# Patient Record
Sex: Female | Born: 1950 | Hispanic: Yes | Marital: Single | Smoking: Former smoker
Health system: Southern US, Community
[De-identification: ages and names within clinical notes are randomized; demographics above are authoritative.]

## PROBLEM LIST (undated history)

## (undated) DIAGNOSIS — F99 Mental disorder, not otherwise specified: Secondary | ICD-10-CM

## (undated) DIAGNOSIS — N649 Disorder of breast, unspecified: Secondary | ICD-10-CM

## (undated) DIAGNOSIS — C8590 Non-Hodgkin lymphoma, unspecified, unspecified site: Secondary | ICD-10-CM

## (undated) DIAGNOSIS — E119 Type 2 diabetes mellitus without complications: Secondary | ICD-10-CM

## (undated) DIAGNOSIS — J449 Chronic obstructive pulmonary disease, unspecified: Secondary | ICD-10-CM

## (undated) DIAGNOSIS — C50919 Malignant neoplasm of unspecified site of unspecified female breast: Secondary | ICD-10-CM

## (undated) DIAGNOSIS — I639 Cerebral infarction, unspecified: Secondary | ICD-10-CM

## (undated) DIAGNOSIS — I10 Essential (primary) hypertension: Secondary | ICD-10-CM

## (undated) HISTORY — DX: Disorder of breast, unspecified: N64.9

## (undated) HISTORY — DX: Malignant neoplasm of unspecified site of unspecified female breast: C50.919

## (undated) HISTORY — DX: Essential (primary) hypertension: I10

## (undated) HISTORY — PX: ABDOMINAL HYSTERECTOMY: SHX81

## (undated) HISTORY — PX: BREAST RECONSTRUCTION: SHX9

## (undated) HISTORY — DX: Mental disorder, not otherwise specified: F99

## (undated) HISTORY — DX: Chronic obstructive pulmonary disease, unspecified: J44.9

## (undated) HISTORY — PX: BACK SURGERY: SHX140

## (undated) HISTORY — DX: Type 2 diabetes mellitus without complications: E11.9

---

## 1991-03-21 DIAGNOSIS — C50919 Malignant neoplasm of unspecified site of unspecified female breast: Secondary | ICD-10-CM

## 1991-03-21 DIAGNOSIS — N649 Disorder of breast, unspecified: Secondary | ICD-10-CM

## 1991-03-21 HISTORY — DX: Malignant neoplasm of unspecified site of unspecified female breast: C50.919

## 1991-03-21 HISTORY — DX: Disorder of breast, unspecified: N64.9

## 2016-06-23 DIAGNOSIS — I10 Essential (primary) hypertension: Secondary | ICD-10-CM | POA: Diagnosis not present

## 2016-06-23 DIAGNOSIS — G4700 Insomnia, unspecified: Secondary | ICD-10-CM | POA: Diagnosis not present

## 2016-07-16 DIAGNOSIS — J209 Acute bronchitis, unspecified: Secondary | ICD-10-CM | POA: Diagnosis not present

## 2016-09-13 ENCOUNTER — Other Ambulatory Visit (HOSPITAL_COMMUNITY)
Admission: RE | Admit: 2016-09-13 | Discharge: 2016-09-13 | Disposition: A | Discharge status: Home / Self Care | Payer: Medicare Other | Source: Ambulatory Visit | Attending: Obstetrics & Gynecology | Admitting: Obstetrics & Gynecology

## 2016-09-13 ENCOUNTER — Ambulatory Visit (INDEPENDENT_AMBULATORY_CARE_PROVIDER_SITE_OTHER): Payer: Medicare Other | Admitting: Obstetrics & Gynecology

## 2016-09-13 ENCOUNTER — Encounter: Payer: Self-pay | Admitting: Obstetrics & Gynecology

## 2016-09-13 VITALS — BP 134/98 | Ht 65.0 in | Wt 134.0 lb

## 2016-09-13 DIAGNOSIS — Z853 Personal history of malignant neoplasm of breast: Secondary | ICD-10-CM | POA: Insufficient documentation

## 2016-09-13 DIAGNOSIS — Z90710 Acquired absence of both cervix and uterus: Secondary | ICD-10-CM | POA: Insufficient documentation

## 2016-09-13 DIAGNOSIS — Z01419 Encounter for gynecological examination (general) (routine) without abnormal findings: Secondary | ICD-10-CM | POA: Insufficient documentation

## 2016-09-13 DIAGNOSIS — Z1239 Encounter for other screening for malignant neoplasm of breast: Secondary | ICD-10-CM

## 2016-09-13 DIAGNOSIS — N898 Other specified noninflammatory disorders of vagina: Secondary | ICD-10-CM | POA: Diagnosis not present

## 2016-09-13 NOTE — Addendum Note (Signed)
Addended by: Lavonia Drafts on: 09/13/2016 03:25 PM   Modules accepted: Level of Service

## 2016-09-13 NOTE — Progress Notes (Signed)
Subjective:     Sandra Wade is a 66 y.o. female here for a routine exam. G3P0030 LMP 1993 Current complaints: pt is s/p breast ca dx'd in 1993. Pt took Tamoxifen for 2 years and stopped. She has not seen a doc re breast disease since 2008.  She is S/p left mastectomy and chemo; no radiation. Pt is also s/p TAH with BSO in 1994 for prophylaxis for the breast cancer. Pt reports the finding of scar tissue and endometrial  polyps in her uterus at the time of surgery.  No bleeding since that time or GYN other GYN problems since that time.  Pt reports currently freq yeast infxn. She reports that it is  'due to [place]'.  She reportts that her glucose is 'well controlled.'Pt checked her glc this am and it was 200. She reports that it was 'good'    Gynecologic History No LMP recorded. Patient has had a hysterectomy. Contraception: status post hysterectomy Last Pap: 2000's never abnormal Last mammogram: 2002. Results were: normal  Obstetric History Pt s/p SAB x 3-5  The following portions of the patient's history were reviewed and updated as appropriate: allergies, current medications, past family history, past medical history, past social history, past surgical history and problem list.  Review of Systems Pertinent items are noted in HPI.    Objective:  BP (!) 134/98 (BP Location: Right Arm)   Ht 5\' 5"  (1.651 m)   Wt 134 lb (60.8 kg)   BMI 22.30 kg/m   General Appearance:    Alert x 3, cooperative, no distress, appears stated age; pt has a very animated affect. Answers question sappropriately   Head:    Normocephalic, without obvious abnormality, atraumatic  Eyes:    conjunctiva/corneas clear, EOM's intact, both eyes- pt worse sunglasses most of the visit  Ears:    Normal external ear canals, both ears  Nose:   Nares normal, septum midline, mucosa normal, no drainage    or sinus tenderness  Throat:   Lips, mucosa, and tongue normal; teeth and gums normal  Neck:   Supple, symmetrical,  trachea midline, no adenopathy;    thyroid:  no enlargement/tenderness/nodules  Back:     Symmetric, no curvature, ROM normal, no CVA tenderness  Lungs:     Clear to auscultation bilaterally, respirations unlabored  Chest Wall:    No tenderness or deformity   Heart:    Regular rate and rhythm, S1 and S2 normal, no murmur, rub   or gallop  Breast Exam:    No tenderness, masses, or nipple abnormality; evidence of a mastectomy with reconstruction on the left side. Evidence of redxn mammoplasty on the right side.   Abdomen:     Soft, non-tender, bowel sounds active all four quadrants,    no masses, no organomegaly. Well healed abdominoplasty incision   Genitalia:    Normal female without lesion, discharge or tenderness- cervix and uterus surgically absent. Atrophy noted.     Extremities:   Extremities normal, atraumatic, no cyanosis or edema  Pulses:   2+ and symmetric all extremities  Skin:   Skin color, texture, turgor normal, no rashes or lesions     Assessment:    Healthy female exam.   H/o breast cancer- no recent follow up. Tamoxifen for 2 years Pt recently made an appt with for [place] primary care. Labs obtained today Possible chronic yeast- would suspect that this is from uncontrolled DM- no evidence of yeast on exam - KOH pending.  Plan:    Mammogram ordered. Follow up in: 1 year.    KOH pending Labs: HgbA1c, TSH , CBC, CMP and lipid panel drawn today for primary care   Eri Mcevers L. Harraway-Smith, M.D., Cherlynn June

## 2016-09-14 LAB — COMPREHENSIVE METABOLIC PANEL
ALK PHOS: 73 IU/L (ref 39–117)
ALT: 14 IU/L (ref 0–32)
AST: 17 IU/L (ref 0–40)
Albumin/Globulin Ratio: 1.6 (ref 1.2–2.2)
Albumin: 4.1 g/dL (ref 3.6–4.8)
BUN/Creatinine Ratio: 23 (ref 12–28)
BUN: 20 mg/dL (ref 8–27)
Bilirubin Total: 0.5 mg/dL (ref 0.0–1.2)
CALCIUM: 10.2 mg/dL (ref 8.7–10.3)
CO2: 30 mmol/L — AB (ref 20–29)
CREATININE: 0.88 mg/dL (ref 0.57–1.00)
Chloride: 97 mmol/L (ref 96–106)
GFR calc Af Amer: 80 mL/min/{1.73_m2} (ref >59)
GFR, EST NON AFRICAN AMERICAN: 69 mL/min/{1.73_m2} (ref >59)
GLUCOSE: 181 mg/dL — AB (ref 65–99)
Globulin, Total: 2.5 g/dL (ref 1.5–4.5)
Potassium: 4.7 mmol/L (ref 3.5–5.2)
Sodium: 142 mmol/L (ref 134–144)
Total Protein: 6.6 g/dL (ref 6.0–8.5)

## 2016-09-14 LAB — LIPID PANEL
CHOLESTEROL TOTAL: 183 mg/dL (ref 100–199)
Chol/HDL Ratio: 2.9 ratio (ref 0.0–4.4)
HDL: 64 mg/dL (ref >39)
LDL Calculated: 89 mg/dL (ref 0–99)
TRIGLYCERIDES: 148 mg/dL (ref 0–149)
VLDL Cholesterol Cal: 30 mg/dL (ref 5–40)

## 2016-09-14 LAB — HEMOGLOBIN A1C
ESTIMATED AVERAGE GLUCOSE: 237 mg/dL
HEMOGLOBIN A1C: 9.9 % — AB (ref 4.8–5.6)

## 2016-09-14 LAB — CBC
HEMATOCRIT: 41.5 % (ref 34.0–46.6)
HEMOGLOBIN: 13.9 g/dL (ref 11.1–15.9)
MCH: 29.7 pg (ref 26.6–33.0)
MCHC: 33.5 g/dL (ref 31.5–35.7)
MCV: 89 fL (ref 79–97)
Platelets: 186 10*3/uL (ref 150–379)
RBC: 4.68 x10E6/uL (ref 3.77–5.28)
RDW: 13.9 % (ref 12.3–15.4)
WBC: 6.7 10*3/uL (ref 3.4–10.8)

## 2016-09-14 LAB — TSH: TSH: 1.07 u[IU]/mL (ref 0.450–4.500)

## 2016-09-15 LAB — CERVICOVAGINAL ANCILLARY ONLY
BACTERIAL VAGINITIS: NEGATIVE
Candida vaginitis: NEGATIVE

## 2016-09-18 ENCOUNTER — Inpatient Hospital Stay (HOSPITAL_BASED_OUTPATIENT_CLINIC_OR_DEPARTMENT_OTHER): Admission: RE | Admit: 2016-09-18 | Payer: Medicare Other | Source: Ambulatory Visit

## 2016-09-21 ENCOUNTER — Telehealth: Payer: Self-pay

## 2016-09-21 DIAGNOSIS — I10 Essential (primary) hypertension: Secondary | ICD-10-CM | POA: Diagnosis not present

## 2016-09-21 DIAGNOSIS — M545 Low back pain: Secondary | ICD-10-CM | POA: Diagnosis not present

## 2016-09-21 DIAGNOSIS — Z760 Encounter for issue of repeat prescription: Secondary | ICD-10-CM | POA: Diagnosis not present

## 2016-09-21 NOTE — Telephone Encounter (Signed)
Tried to reach pt to let her know that her testing came back negative for BV and yeast. Pt did not answer phone and her voicemail is not set up.

## 2016-09-25 ENCOUNTER — Telehealth: Payer: Self-pay | Admitting: General Practice

## 2016-09-25 NOTE — Telephone Encounter (Signed)
Caller name: Ezzard Flax from Dr. Ihor Dow  Relation to pt: clinic scheduler  Call back number: ext [phone]  Reason for call:  Patient was seen by Lavonia Drafts, MD 09/13/16 and specialist would like to know if patient can be seen for diabetic concerns acute only prior to her 10/19/16 new patient appointment, please advise.

## 2016-09-25 NOTE — Telephone Encounter (Signed)
Patient schedule for 09/27/2016 30 minutes.

## 2016-09-25 NOTE — Telephone Encounter (Signed)
Sure that is fine, we can schedule her

## 2016-09-26 NOTE — Progress Notes (Deleted)
[place] at [facility] [address] [place], [place] [address] [phone]  Date:  09/27/2016   Name:  Sandra Wade   DOB:  11/17/1950   MRN:  [id number]  PCP:  Patient, No Pcp Per    Chief Complaint: No chief complaint on file.   History of Present Illness:  Sandra Wade is a 66 y.o. very pleasant female patient who presents with the following:  Here today as a new patient to discuss her diabetes.  Dr. Purvis Kilts saw her about 2 weeks ago and was concerned about her uncontrolled DM: Sandra Wade is a 66 y.o. female here for a routine exam. G3P0030 LMP 1993 Current complaints: pt is s/p breast ca dx'd in 1993. Pt took Tamoxifen for 2 years and stopped. She has not seen a doc re breast disease since 2008.  She is S/p left mastectomy and chemo; no radiation. Pt is also s/p TAH with BSO in 1994 for prophylaxis for the breast cancer. Pt reports the finding of scar tissue and endometrial  polyps in her uterus at the time of surgery.  No bleeding since that time or GYN other GYN problems since that time.  Pt reports currently freq yeast infxn. She reports that it is  'due to [place]'.  She reportts that her glucose is 'well controlled.'Pt checked her glc this am and it was 200. She reports that it was 'good'    She had labs per Dr. Sheldon Silvan as below- her Dm does not appear to be under good control.   Office Visit on 09/13/2016  Component Date Value Ref Range Status  . WBC 09/13/2016 6.7  3.4 - 10.8 x10E3/uL Final  . RBC 09/13/2016 4.68  3.77 - 5.28 x10E6/uL Final  . Hemoglobin 09/13/2016 13.9  11.1 - 15.9 g/dL Final  . Hematocrit 09/13/2016 41.5  34.0 - 46.6 % Final  . MCV 09/13/2016 89  79 - 97 fL Final  . MCH 09/13/2016 29.7  26.6 - 33.0 pg Final  . MCHC 09/13/2016 33.5  31.5 - 35.7 g/dL Final  . RDW 09/13/2016 13.9  12.3 - 15.4 % Final  . Platelets 09/13/2016 186  150 - 379 x10E3/uL Final  . Glucose 09/13/2016 181* 65  - 99 mg/dL Final  . BUN 09/13/2016 20  8 - 27 mg/dL Final  . Creatinine, Ser 09/13/2016 0.88  0.57 - 1.00 mg/dL Final  . GFR calc non Af Amer 09/13/2016 69  >59 mL/min/1.73 Final  . GFR calc Af Amer 09/13/2016 80  >59 mL/min/1.73 Final  . BUN/Creatinine Ratio 09/13/2016 23  12 - 28 Final  . Sodium 09/13/2016 142  134 - 144 mmol/L Final  . Potassium 09/13/2016 4.7  3.5 - 5.2 mmol/L Final  . Chloride 09/13/2016 97  96 - 106 mmol/L Final  . CO2 09/13/2016 30* 20 - 29 mmol/L Final                 **Please note reference interval change**  . Calcium 09/13/2016 10.2  8.7 - 10.3 mg/dL Final  . Total Protein 09/13/2016 6.6  6.0 - 8.5 g/dL Final  . Albumin 09/13/2016 4.1  3.6 - 4.8 g/dL Final  . Globulin, Total 09/13/2016 2.5  1.5 - 4.5 g/dL Final  . Albumin/Globulin Ratio 09/13/2016 1.6  1.2 - 2.2 Final  . Bilirubin Total 09/13/2016 0.5  0.0 - 1.2 mg/dL Final  . Alkaline Phosphatase 09/13/2016 73  39 - 117 IU/L Final  .  AST 09/13/2016 17  0 - 40 IU/L Final  . ALT 09/13/2016 14  0 - 32 IU/L Final  . TSH 09/13/2016 1.070  0.450 - 4.500 uIU/mL Final  . Hgb A1c MFr Bld 09/13/2016 9.9* 4.8 - 5.6 % Final   Comment:          Pre-diabetes: 5.7 - 6.4          Diabetes: >6.4          Glycemic control for adults with diabetes: <7.0   . Est. average glucose Bld gHb Est-m* 09/13/2016 237  mg/dL Final  . Cholesterol, Total 09/13/2016 183  100 - 199 mg/dL Final  . Triglycerides 09/13/2016 148  0 - 149 mg/dL Final  . HDL 09/13/2016 64  >39 mg/dL Final  . VLDL Cholesterol Cal 09/13/2016 30  5 - 40 mg/dL Final  . LDL Calculated 09/13/2016 89  0 - 99 mg/dL Final  . Chol/HDL Ratio 09/13/2016 2.9  0.0 - 4.4 ratio Final   Comment:                                   T. Chol/HDL Ratio                                             Men  Women                               1/2 Avg.Risk  3.4    3.3                                   Avg.Risk  5.0    4.4                                2X Avg.Risk  9.6    7.1                                 3X Avg.Risk 23.4   11.0   . Bacterial vaginitis 09/13/2016 Negative for Bacterial Vaginitis Microorganisms   Final   Normal Reference Range - Negative  . Candida vaginitis 09/13/2016 Negative for Candida species   Final   Normal Reference Range - Negative     There are no active problems to display for this patient.   Past Medical History:  Diagnosis Date  . Breast cancer ([place]) 1993  . Breast disorder 1993   Left breast cancer   . COPD (chronic obstructive pulmonary disease) ([place])   . Diabetes mellitus without complication ([place])   . Hypertension   . Mental disorder     Past Surgical History:  Procedure Laterality Date  . ABDOMINAL HYSTERECTOMY    . BACK SURGERY    . BREAST RECONSTRUCTION      Social History  Substance Use Topics  . Smoking status: Current Every Day Smoker    Packs/day: 1.00    Years: 20.00  . Smokeless tobacco: Never Used  . Alcohol use Yes    Family History  Problem Relation Age of Onset  . Hypertension Mother   .  Stroke Brother   . Cancer Neg Hx   . Diabetes Neg Hx     No Known Allergies  Medication list has been reviewed and updated.  Current Outpatient Prescriptions on File Prior to Visit  Medication Sig Dispense Refill  . amLODipine (NORVASC) 10 MG tablet Take 10 mg by mouth daily.    . benazepril (LOTENSIN) 40 MG tablet Take 40 mg by mouth daily.    . clonazePAM (KLONOPIN) 1 MG tablet     . cloNIDine (CATAPRES) 0.1 MG tablet Take 0.1 mg by mouth 2 (two) times daily.    . divalproex (DEPAKOTE) 500 MG DR tablet     . HYDROcodone-acetaminophen (NORCO) 7.5-325 MG tablet Take 1 tablet by mouth every 6 (six) hours as needed for moderate pain.    Marland Kitchen LANTUS SOLOSTAR 100 UNIT/ML Solostar Pen     . levofloxacin (LEVAQUIN) 500 MG tablet     . ONE TOUCH ULTRA TEST test strip     . VICTOZA 18 MG/3ML SOPN     . zolpidem (AMBIEN) 10 MG tablet      No current facility-administered medications on file prior to visit.      Review of Systems:  As per HPI- otherwise negative.   Physical Examination: There were no vitals filed for this visit. There were no vitals filed for this visit. There is no height or weight on file to calculate BMI. Ideal Body Weight:    GEN: WDWN, NAD, Non-toxic, A & O x 3 HEENT: Atraumatic, Normocephalic. Neck supple. No masses, No LAD. Ears and Nose: No external deformity. CV: RRR, No M/G/R. No JVD. No thrill. No extra heart sounds. PULM: CTA B, no wheezes, crackles, rhonchi. No retractions. No resp. distress. No accessory muscle use. ABD: S, NT, ND, +BS. No rebound. No HSM. EXTR: No c/c/e NEURO Normal gait.  PSYCH: Normally interactive. Conversant. Not depressed or anxious appearing.  Calm demeanor.    Assessment and Plan: ***  Signed Lamar Blinks, MD

## 2016-09-27 ENCOUNTER — Ambulatory Visit: Payer: Medicare Other | Admitting: Family Medicine

## 2016-10-18 ENCOUNTER — Telehealth: Payer: Self-pay | Admitting: Behavioral Health

## 2016-10-18 NOTE — Telephone Encounter (Signed)
Attempted to reach patient for Pre-Visit Call. Per recording, the voice mailbox has not been set up; unable to leave a message at this time.

## 2016-10-19 ENCOUNTER — Ambulatory Visit: Payer: Self-pay | Admitting: Family Medicine

## 2016-10-19 DIAGNOSIS — Z0289 Encounter for other administrative examinations: Secondary | ICD-10-CM

## 2016-12-24 DIAGNOSIS — R7309 Other abnormal glucose: Secondary | ICD-10-CM | POA: Diagnosis not present

## 2016-12-24 DIAGNOSIS — Z794 Long term (current) use of insulin: Secondary | ICD-10-CM | POA: Diagnosis not present

## 2016-12-24 DIAGNOSIS — E119 Type 2 diabetes mellitus without complications: Secondary | ICD-10-CM | POA: Diagnosis not present

## 2017-01-19 ENCOUNTER — Ambulatory Visit (HOSPITAL_BASED_OUTPATIENT_CLINIC_OR_DEPARTMENT_OTHER)
Admission: RE | Admit: 2017-01-19 | Discharge: 2017-01-19 | Disposition: A | Discharge status: Home / Self Care | Payer: Medicare Other | Source: Ambulatory Visit | Attending: Physician Assistant | Admitting: Physician Assistant

## 2017-01-19 ENCOUNTER — Other Ambulatory Visit (HOSPITAL_BASED_OUTPATIENT_CLINIC_OR_DEPARTMENT_OTHER): Payer: Self-pay | Admitting: Physician Assistant

## 2017-01-19 DIAGNOSIS — M7989 Other specified soft tissue disorders: Secondary | ICD-10-CM | POA: Diagnosis not present

## 2017-01-19 DIAGNOSIS — R600 Localized edema: Secondary | ICD-10-CM | POA: Diagnosis not present

## 2017-01-19 DIAGNOSIS — I890 Lymphedema, not elsewhere classified: Secondary | ICD-10-CM | POA: Diagnosis not present

## 2017-01-19 DIAGNOSIS — W57XXXA Bitten or stung by nonvenomous insect and other nonvenomous arthropods, initial encounter: Secondary | ICD-10-CM | POA: Diagnosis not present

## 2017-01-21 NOTE — Progress Notes (Deleted)
[place] at [facility] [address], [place], [place] [address] [phone]  Date:  01/24/2017   Name:  Sandra Wade   DOB:  1950/10/24   MRN:  [id number]  PCP:  Patient, No Pcp Per    Chief Complaint: No chief complaint on file.   History of Present Illness:  Sandra Wade is a 66 y.o. very pleasant female patient who presents with the following:  Here today as a new patient to establish care History of breast cancer, COPD, DM, HTN, mental illness She went to an UC about a month ago as she did not have a PCP to rx her medications:  Sandra Wade is a 66 y.o. who presents as a new patient for diabetes. She notes she ran out of the victoza and needs a refill. She has not established with a PCP at this time. Current symptoms include: none. Patient denies foot ulcerations, hyperglycemia, hypoglycemia , increased appetite, nausea, paresthesia of the feet, polydipsia, polyuria, visual disturbances, vomiting and weight loss. Evaluation to date has included: fasting blood sugar, fasting lipid panel, hemoglobin A1C and microalbuminuria. Home sugars: BGs range between 140 and 150. Current treatments: Continued insulin which has been somewhat effective and Continued ACE inhibitor/ARB which has been effective. The patient denies chest pain, LOC, LE edema, abdominal pain, SOB, wheezing, fever, chills, body aches, bloody/black stools, hematuria, or changes in urinary or bowel habits.  Lab Results  Component Value Date   HGBA1C 9.9 (H) 09/13/2016     There are no active problems to display for this patient.   Past Medical History:  Diagnosis Date  . Breast cancer ([place]) 1993  . Breast disorder 1993   Left breast cancer   . COPD (chronic obstructive pulmonary disease) ([place])   . Diabetes mellitus without complication ([place])   . Hypertension   . Mental disorder     Past Surgical History:  Procedure Laterality Date  .  ABDOMINAL HYSTERECTOMY    . BACK SURGERY    . BREAST RECONSTRUCTION      Social History   Tobacco Use  . Smoking status: Current Every Day Smoker    Packs/day: 1.00    Years: 20.00    Pack years: 20.00  . Smokeless tobacco: Never Used  Substance Use Topics  . Alcohol use: Yes  . Drug use: No    Family History  Problem Relation Age of Onset  . Hypertension Mother   . Stroke Brother   . Cancer Neg Hx   . Diabetes Neg Hx     No Known Allergies  Medication list has been reviewed and updated.  Current Outpatient Medications on File Prior to Visit  Medication Sig Dispense Refill  . amLODipine (NORVASC) 10 MG tablet Take 10 mg by mouth daily.    . benazepril (LOTENSIN) 40 MG tablet Take 40 mg by mouth daily.    . clonazePAM (KLONOPIN) 1 MG tablet     . cloNIDine (CATAPRES) 0.1 MG tablet Take 0.1 mg by mouth 2 (two) times daily.    . divalproex (DEPAKOTE) 500 MG DR tablet     . HYDROcodone-acetaminophen (NORCO) 7.5-325 MG tablet Take 1 tablet by mouth every 6 (six) hours as needed for moderate pain.    Marland Kitchen LANTUS SOLOSTAR 100 UNIT/ML Solostar Pen     . levofloxacin (LEVAQUIN) 500 MG tablet     . ONE TOUCH ULTRA TEST test strip     . VICTOZA 18 MG/3ML SOPN     .  zolpidem (AMBIEN) 10 MG tablet      No current facility-administered medications on file prior to visit.     Review of Systems:  As per HPI- otherwise negative.   Physical Examination: There were no vitals filed for this visit. There were no vitals filed for this visit. There is no height or weight on file to calculate BMI. Ideal Body Weight:    GEN: WDWN, NAD, Non-toxic, A & O x 3 HEENT: Atraumatic, Normocephalic. Neck supple. No masses, No LAD. Ears and Nose: No external deformity. CV: RRR, No M/G/R. No JVD. No thrill. No extra heart sounds. PULM: CTA B, no wheezes, crackles, rhonchi. No retractions. No resp. distress. No accessory muscle use. ABD: S, NT, ND, +BS. No rebound. No HSM. EXTR: No  c/c/e NEURO Normal gait.  PSYCH: Normally interactive. Conversant. Not depressed or anxious appearing.  Calm demeanor.    Assessment and Plan: ***  Signed Lamar Blinks, MD

## 2017-01-24 ENCOUNTER — Ambulatory Visit: Payer: Medicare Other | Admitting: Family Medicine

## 2017-02-22 DIAGNOSIS — R319 Hematuria, unspecified: Secondary | ICD-10-CM | POA: Diagnosis not present

## 2017-02-22 DIAGNOSIS — Z794 Long term (current) use of insulin: Secondary | ICD-10-CM | POA: Diagnosis not present

## 2017-02-22 DIAGNOSIS — R42 Dizziness and giddiness: Secondary | ICD-10-CM | POA: Diagnosis not present

## 2017-02-22 DIAGNOSIS — R350 Frequency of micturition: Secondary | ICD-10-CM | POA: Diagnosis not present

## 2017-02-22 DIAGNOSIS — E119 Type 2 diabetes mellitus without complications: Secondary | ICD-10-CM | POA: Diagnosis not present

## 2017-04-26 DIAGNOSIS — R319 Hematuria, unspecified: Secondary | ICD-10-CM | POA: Diagnosis not present

## 2017-04-26 DIAGNOSIS — E1165 Type 2 diabetes mellitus with hyperglycemia: Secondary | ICD-10-CM | POA: Diagnosis not present

## 2017-06-14 DIAGNOSIS — J209 Acute bronchitis, unspecified: Secondary | ICD-10-CM | POA: Diagnosis not present

## 2017-06-14 DIAGNOSIS — S335XXA Sprain of ligaments of lumbar spine, initial encounter: Secondary | ICD-10-CM | POA: Diagnosis not present

## 2017-06-14 DIAGNOSIS — G4700 Insomnia, unspecified: Secondary | ICD-10-CM | POA: Diagnosis not present

## 2017-06-14 DIAGNOSIS — H1132 Conjunctival hemorrhage, left eye: Secondary | ICD-10-CM | POA: Diagnosis not present

## 2017-06-14 DIAGNOSIS — S0093XA Contusion of unspecified part of head, initial encounter: Secondary | ICD-10-CM | POA: Diagnosis not present

## 2017-07-30 DIAGNOSIS — I10 Essential (primary) hypertension: Secondary | ICD-10-CM | POA: Diagnosis not present

## 2017-07-30 DIAGNOSIS — Z794 Long term (current) use of insulin: Secondary | ICD-10-CM | POA: Diagnosis not present

## 2017-07-30 DIAGNOSIS — E1165 Type 2 diabetes mellitus with hyperglycemia: Secondary | ICD-10-CM | POA: Diagnosis not present

## 2017-08-08 DIAGNOSIS — M79672 Pain in left foot: Secondary | ICD-10-CM | POA: Diagnosis not present

## 2017-08-08 DIAGNOSIS — M79671 Pain in right foot: Secondary | ICD-10-CM | POA: Diagnosis not present

## 2017-08-08 DIAGNOSIS — E1142 Type 2 diabetes mellitus with diabetic polyneuropathy: Secondary | ICD-10-CM | POA: Diagnosis not present

## 2017-08-08 DIAGNOSIS — I872 Venous insufficiency (chronic) (peripheral): Secondary | ICD-10-CM | POA: Diagnosis not present

## 2017-08-22 DIAGNOSIS — L03115 Cellulitis of right lower limb: Secondary | ICD-10-CM | POA: Diagnosis not present

## 2017-08-28 DIAGNOSIS — K5909 Other constipation: Secondary | ICD-10-CM | POA: Diagnosis not present

## 2017-08-28 DIAGNOSIS — E1165 Type 2 diabetes mellitus with hyperglycemia: Secondary | ICD-10-CM | POA: Diagnosis not present

## 2017-08-28 DIAGNOSIS — Z794 Long term (current) use of insulin: Secondary | ICD-10-CM | POA: Diagnosis not present

## 2017-08-28 DIAGNOSIS — I8392 Asymptomatic varicose veins of left lower extremity: Secondary | ICD-10-CM | POA: Diagnosis not present

## 2017-08-28 DIAGNOSIS — L03115 Cellulitis of right lower limb: Secondary | ICD-10-CM | POA: Diagnosis not present

## 2017-09-24 DIAGNOSIS — M5416 Radiculopathy, lumbar region: Secondary | ICD-10-CM | POA: Diagnosis not present

## 2017-09-29 DIAGNOSIS — S90221A Contusion of right lesser toe(s) with damage to nail, initial encounter: Secondary | ICD-10-CM | POA: Diagnosis not present

## 2017-11-01 DIAGNOSIS — Z794 Long term (current) use of insulin: Secondary | ICD-10-CM | POA: Diagnosis not present

## 2017-11-01 DIAGNOSIS — E1165 Type 2 diabetes mellitus with hyperglycemia: Secondary | ICD-10-CM | POA: Diagnosis not present

## 2017-11-01 DIAGNOSIS — E1142 Type 2 diabetes mellitus with diabetic polyneuropathy: Secondary | ICD-10-CM | POA: Diagnosis not present

## 2017-11-01 DIAGNOSIS — I872 Venous insufficiency (chronic) (peripheral): Secondary | ICD-10-CM | POA: Diagnosis not present

## 2017-11-01 DIAGNOSIS — K5909 Other constipation: Secondary | ICD-10-CM | POA: Diagnosis not present

## 2017-11-30 DIAGNOSIS — Z794 Long term (current) use of insulin: Secondary | ICD-10-CM | POA: Diagnosis not present

## 2017-11-30 DIAGNOSIS — R42 Dizziness and giddiness: Secondary | ICD-10-CM | POA: Diagnosis not present

## 2017-11-30 DIAGNOSIS — I10 Essential (primary) hypertension: Secondary | ICD-10-CM | POA: Diagnosis not present

## 2017-11-30 DIAGNOSIS — E1165 Type 2 diabetes mellitus with hyperglycemia: Secondary | ICD-10-CM | POA: Diagnosis not present

## 2017-12-13 DIAGNOSIS — E1142 Type 2 diabetes mellitus with diabetic polyneuropathy: Secondary | ICD-10-CM | POA: Diagnosis not present

## 2017-12-13 DIAGNOSIS — J90 Pleural effusion, not elsewhere classified: Secondary | ICD-10-CM | POA: Diagnosis not present

## 2017-12-13 DIAGNOSIS — E11319 Type 2 diabetes mellitus with unspecified diabetic retinopathy without macular edema: Secondary | ICD-10-CM | POA: Diagnosis not present

## 2017-12-13 DIAGNOSIS — Z23 Encounter for immunization: Secondary | ICD-10-CM | POA: Diagnosis not present

## 2017-12-13 DIAGNOSIS — J9811 Atelectasis: Secondary | ICD-10-CM | POA: Diagnosis not present

## 2017-12-13 DIAGNOSIS — L84 Corns and callosities: Secondary | ICD-10-CM | POA: Diagnosis not present

## 2017-12-17 DIAGNOSIS — R9389 Abnormal findings on diagnostic imaging of other specified body structures: Secondary | ICD-10-CM | POA: Diagnosis not present

## 2017-12-17 DIAGNOSIS — I700 Atherosclerosis of aorta: Secondary | ICD-10-CM | POA: Diagnosis not present

## 2017-12-17 DIAGNOSIS — J984 Other disorders of lung: Secondary | ICD-10-CM | POA: Diagnosis not present

## 2017-12-17 DIAGNOSIS — R918 Other nonspecific abnormal finding of lung field: Secondary | ICD-10-CM | POA: Diagnosis not present

## 2017-12-17 DIAGNOSIS — R042 Hemoptysis: Secondary | ICD-10-CM | POA: Diagnosis not present

## 2017-12-17 DIAGNOSIS — R05 Cough: Secondary | ICD-10-CM | POA: Diagnosis not present

## 2017-12-27 DIAGNOSIS — R809 Proteinuria, unspecified: Secondary | ICD-10-CM | POA: Diagnosis not present

## 2017-12-27 DIAGNOSIS — N179 Acute kidney failure, unspecified: Secondary | ICD-10-CM | POA: Diagnosis not present

## 2017-12-27 DIAGNOSIS — E1129 Type 2 diabetes mellitus with other diabetic kidney complication: Secondary | ICD-10-CM | POA: Diagnosis not present

## 2017-12-27 DIAGNOSIS — I10 Essential (primary) hypertension: Secondary | ICD-10-CM | POA: Diagnosis not present

## 2018-01-02 DIAGNOSIS — N179 Acute kidney failure, unspecified: Secondary | ICD-10-CM | POA: Diagnosis not present

## 2018-01-18 DIAGNOSIS — M79672 Pain in left foot: Secondary | ICD-10-CM | POA: Diagnosis not present

## 2018-01-18 DIAGNOSIS — M7989 Other specified soft tissue disorders: Secondary | ICD-10-CM | POA: Diagnosis not present

## 2018-01-18 DIAGNOSIS — R2242 Localized swelling, mass and lump, left lower limb: Secondary | ICD-10-CM | POA: Diagnosis not present

## 2018-03-02 ENCOUNTER — Emergency Department (HOSPITAL_BASED_OUTPATIENT_CLINIC_OR_DEPARTMENT_OTHER): Payer: Medicare Other

## 2018-03-02 ENCOUNTER — Other Ambulatory Visit: Payer: Self-pay

## 2018-03-02 ENCOUNTER — Encounter (HOSPITAL_BASED_OUTPATIENT_CLINIC_OR_DEPARTMENT_OTHER): Payer: Self-pay | Admitting: Emergency Medicine

## 2018-03-02 ENCOUNTER — Inpatient Hospital Stay (HOSPITAL_BASED_OUTPATIENT_CLINIC_OR_DEPARTMENT_OTHER)
Admission: EM | Admit: 2018-03-02 | Discharge: 2018-03-06 | DRG: 193 | Disposition: A | Discharge status: Home Health | Payer: Medicare Other | Attending: Internal Medicine | Admitting: Internal Medicine

## 2018-03-02 DIAGNOSIS — Z90710 Acquired absence of both cervix and uterus: Secondary | ICD-10-CM

## 2018-03-02 DIAGNOSIS — Z87891 Personal history of nicotine dependence: Secondary | ICD-10-CM

## 2018-03-02 DIAGNOSIS — E1165 Type 2 diabetes mellitus with hyperglycemia: Secondary | ICD-10-CM | POA: Diagnosis not present

## 2018-03-02 DIAGNOSIS — T380X5A Adverse effect of glucocorticoids and synthetic analogues, initial encounter: Secondary | ICD-10-CM | POA: Diagnosis not present

## 2018-03-02 DIAGNOSIS — Z794 Long term (current) use of insulin: Secondary | ICD-10-CM

## 2018-03-02 DIAGNOSIS — J440 Chronic obstructive pulmonary disease with acute lower respiratory infection: Secondary | ICD-10-CM | POA: Diagnosis present

## 2018-03-02 DIAGNOSIS — F419 Anxiety disorder, unspecified: Secondary | ICD-10-CM | POA: Diagnosis present

## 2018-03-02 DIAGNOSIS — Z66 Do not resuscitate: Secondary | ICD-10-CM | POA: Diagnosis not present

## 2018-03-02 DIAGNOSIS — G894 Chronic pain syndrome: Secondary | ICD-10-CM | POA: Diagnosis present

## 2018-03-02 DIAGNOSIS — E119 Type 2 diabetes mellitus without complications: Secondary | ICD-10-CM

## 2018-03-02 DIAGNOSIS — J189 Pneumonia, unspecified organism: Secondary | ICD-10-CM | POA: Diagnosis not present

## 2018-03-02 DIAGNOSIS — I10 Essential (primary) hypertension: Secondary | ICD-10-CM

## 2018-03-02 DIAGNOSIS — Z79899 Other long term (current) drug therapy: Secondary | ICD-10-CM

## 2018-03-02 DIAGNOSIS — R05 Cough: Secondary | ICD-10-CM | POA: Diagnosis not present

## 2018-03-02 DIAGNOSIS — Z743 Need for continuous supervision: Secondary | ICD-10-CM | POA: Diagnosis not present

## 2018-03-02 DIAGNOSIS — Z79891 Long term (current) use of opiate analgesic: Secondary | ICD-10-CM | POA: Diagnosis not present

## 2018-03-02 DIAGNOSIS — Z853 Personal history of malignant neoplasm of breast: Secondary | ICD-10-CM

## 2018-03-02 DIAGNOSIS — J9601 Acute respiratory failure with hypoxia: Secondary | ICD-10-CM | POA: Diagnosis present

## 2018-03-02 DIAGNOSIS — Z720 Tobacco use: Secondary | ICD-10-CM

## 2018-03-02 DIAGNOSIS — R0902 Hypoxemia: Secondary | ICD-10-CM | POA: Diagnosis not present

## 2018-03-02 DIAGNOSIS — F319 Bipolar disorder, unspecified: Secondary | ICD-10-CM

## 2018-03-02 DIAGNOSIS — R0602 Shortness of breath: Secondary | ICD-10-CM | POA: Diagnosis not present

## 2018-03-02 DIAGNOSIS — J441 Chronic obstructive pulmonary disease with (acute) exacerbation: Secondary | ICD-10-CM | POA: Diagnosis not present

## 2018-03-02 LAB — CBC WITH DIFFERENTIAL/PLATELET
Abs Immature Granulocytes: 0.17 10*3/uL — ABNORMAL HIGH (ref 0.00–0.07)
BASOS ABS: 0.1 10*3/uL (ref 0.0–0.1)
Basophils Relative: 1 %
EOS ABS: 0.2 10*3/uL (ref 0.0–0.5)
Eosinophils Relative: 1 %
HCT: 43 % (ref 36.0–46.0)
Hemoglobin: 13.6 g/dL (ref 12.0–15.0)
Immature Granulocytes: 1 %
Lymphocytes Relative: 6 %
Lymphs Abs: 0.8 10*3/uL (ref 0.7–4.0)
MCH: 28.6 pg (ref 26.0–34.0)
MCHC: 31.6 g/dL (ref 30.0–36.0)
MCV: 90.5 fL (ref 80.0–100.0)
Monocytes Absolute: 1.2 10*3/uL — ABNORMAL HIGH (ref 0.1–1.0)
Monocytes Relative: 10 %
Neutro Abs: 9.8 10*3/uL — ABNORMAL HIGH (ref 1.7–7.7)
Neutrophils Relative %: 81 %
Platelets: 282 10*3/uL (ref 150–400)
RBC: 4.75 MIL/uL (ref 3.87–5.11)
RDW: 13.1 % (ref 11.5–15.5)
WBC: 12.2 10*3/uL — ABNORMAL HIGH (ref 4.0–10.5)
nRBC: 0 % (ref 0.0–0.2)

## 2018-03-02 LAB — BASIC METABOLIC PANEL
Anion gap: 10 (ref 5–15)
BUN: 19 mg/dL (ref 8–23)
CO2: 34 mmol/L — AB (ref 22–32)
Calcium: 9.5 mg/dL (ref 8.9–10.3)
Chloride: 90 mmol/L — ABNORMAL LOW (ref 98–111)
Creatinine, Ser: 0.64 mg/dL (ref 0.44–1.00)
GFR calc Af Amer: >60 mL/min (ref >60)
GFR calc non Af Amer: >60 mL/min (ref >60)
Glucose, Bld: 441 mg/dL — ABNORMAL HIGH (ref 70–99)
Potassium: 4 mmol/L (ref 3.5–5.1)
Sodium: 134 mmol/L — ABNORMAL LOW (ref 135–145)

## 2018-03-02 LAB — STREP PNEUMONIAE URINARY ANTIGEN: Strep Pneumo Urinary Antigen: NEGATIVE

## 2018-03-02 LAB — TROPONIN I: Troponin I: 0.06 ng/mL (ref <0.03)

## 2018-03-02 LAB — GLUCOSE, CAPILLARY
Glucose-Capillary: 435 mg/dL — ABNORMAL HIGH (ref 70–99)
Glucose-Capillary: 585 mg/dL (ref 70–99)

## 2018-03-02 LAB — CBG MONITORING, ED: Glucose-Capillary: 437 mg/dL — ABNORMAL HIGH (ref 70–99)

## 2018-03-02 MED ORDER — ALPRAZOLAM 0.5 MG PO TABS
1.0000 mg | ORAL_TABLET | Freq: Every evening | ORAL | Status: DC | PRN
  Administered 2018-03-02 – 2018-03-03 (×2): 1 mg via ORAL
  Administered 2018-03-04: 0.5 mg via ORAL
  Filled 2018-03-02 (×3): qty 2

## 2018-03-02 MED ORDER — OLANZAPINE 10 MG PO TABS
10.0000 mg | ORAL_TABLET | Freq: Every day | ORAL | Status: DC
  Administered 2018-03-02 – 2018-03-05 (×4): 10 mg via ORAL
  Filled 2018-03-02 (×4): qty 1

## 2018-03-02 MED ORDER — ONDANSETRON HCL 4 MG/2ML IJ SOLN
4.0000 mg | Freq: Four times a day (QID) | INTRAMUSCULAR | Status: DC | PRN
  Administered 2018-03-06: 4 mg via INTRAVENOUS
  Filled 2018-03-02: qty 2

## 2018-03-02 MED ORDER — INSULIN ASPART 100 UNIT/ML ~~LOC~~ SOLN: 10.0000 [IU] | Freq: Once | SUBCUTANEOUS | Status: DC

## 2018-03-02 MED ORDER — BENAZEPRIL HCL 40 MG PO TABS
40.0000 mg | ORAL_TABLET | Freq: Every day | ORAL | Status: DC
  Administered 2018-03-03 – 2018-03-06 (×4): 40 mg via ORAL
  Filled 2018-03-02 (×4): qty 1

## 2018-03-02 MED ORDER — INSULIN GLARGINE 100 UNIT/ML ~~LOC~~ SOLN
15.0000 [IU] | Freq: Every day | SUBCUTANEOUS | Status: DC
  Administered 2018-03-02: 15 [IU] via SUBCUTANEOUS
  Filled 2018-03-02: qty 0.15

## 2018-03-02 MED ORDER — SODIUM CHLORIDE 0.9 % IV SOLN
1.0000 g | INTRAVENOUS | Status: DC
  Administered 2018-03-02 – 2018-03-03 (×2): 1 g via INTRAVENOUS
  Filled 2018-03-02 (×3): qty 10

## 2018-03-02 MED ORDER — DIVALPROEX SODIUM 500 MG PO DR TAB
500.0000 mg | DELAYED_RELEASE_TABLET | Freq: Every day | ORAL | Status: DC
  Administered 2018-03-03 – 2018-03-05 (×3): 500 mg via ORAL
  Filled 2018-03-02 (×5): qty 1

## 2018-03-02 MED ORDER — ENOXAPARIN SODIUM 40 MG/0.4ML ~~LOC~~ SOLN
40.0000 mg | SUBCUTANEOUS | Status: DC
  Administered 2018-03-02 – 2018-03-05 (×4): 40 mg via SUBCUTANEOUS
  Filled 2018-03-02 (×4): qty 0.4

## 2018-03-02 MED ORDER — INSULIN ASPART 100 UNIT/ML ~~LOC~~ SOLN
15.0000 [IU] | Freq: Once | SUBCUTANEOUS | Status: AC
  Administered 2018-03-02: 15 [IU] via SUBCUTANEOUS

## 2018-03-02 MED ORDER — AMLODIPINE BESYLATE 10 MG PO TABS
10.0000 mg | ORAL_TABLET | Freq: Every day | ORAL | Status: DC
  Administered 2018-03-03 – 2018-03-06 (×4): 10 mg via ORAL
  Filled 2018-03-02 (×4): qty 1

## 2018-03-02 MED ORDER — ALBUTEROL SULFATE (2.5 MG/3ML) 0.083% IN NEBU
5.0000 mg | INHALATION_SOLUTION | Freq: Once | RESPIRATORY_TRACT | Status: AC
  Administered 2018-03-02: 5 mg via RESPIRATORY_TRACT
  Filled 2018-03-02: qty 6

## 2018-03-02 MED ORDER — METHYLPREDNISOLONE SODIUM SUCC 125 MG IJ SOLR
125.0000 mg | Freq: Once | INTRAMUSCULAR | Status: AC
  Administered 2018-03-02: 125 mg via INTRAVENOUS
  Filled 2018-03-02: qty 2

## 2018-03-02 MED ORDER — ALBUTEROL SULFATE (2.5 MG/3ML) 0.083% IN NEBU: 2.5000 mg | INHALATION_SOLUTION | RESPIRATORY_TRACT | Status: DC | PRN

## 2018-03-02 MED ORDER — AZITHROMYCIN 250 MG PO TABS
500.0000 mg | ORAL_TABLET | ORAL | Status: DC
  Administered 2018-03-02 – 2018-03-03 (×2): 500 mg via ORAL
  Filled 2018-03-02 (×2): qty 2

## 2018-03-02 MED ORDER — INSULIN ASPART 100 UNIT/ML ~~LOC~~ SOLN: 0.0000 [IU] | Freq: Three times a day (TID) | SUBCUTANEOUS | Status: DC

## 2018-03-02 MED ORDER — CLONIDINE HCL 0.1 MG PO TABS
0.1000 mg | ORAL_TABLET | Freq: Two times a day (BID) | ORAL | Status: DC
  Administered 2018-03-02 – 2018-03-04 (×5): 0.1 mg via ORAL
  Filled 2018-03-02 (×6): qty 1

## 2018-03-02 MED ORDER — SODIUM CHLORIDE 0.9 % IV SOLN
INTRAVENOUS | Status: DC | PRN
  Administered 2018-03-02: 250 mL via INTRAVENOUS

## 2018-03-02 NOTE — ED Notes (Signed)
RN wanted pt to stay on 02. Pt placed on bedpan.

## 2018-03-02 NOTE — ED Notes (Signed)
Patient transported to X-ray 

## 2018-03-02 NOTE — ED Notes (Signed)
Troponin 0.06 results given to ED MD and primary nurse

## 2018-03-02 NOTE — Progress Notes (Signed)
SAT 74% on RA. Verified on finger and ear, good waveform. Placed on 6LNC SAT now 91%. RN at bedside

## 2018-03-02 NOTE — ED Notes (Signed)
Delay in xray- Breathing treatment still going

## 2018-03-02 NOTE — H&P (Signed)
History and Physical    Sandra Wade MGQ:[id number] DOB: 1951/01/06 DOA: 03/02/2018  PCP: Elisabeth Cara, PA-C  Patient coming from: [facility]  I have personally briefly reviewed patient's old medical records in [place]  Chief Complaint: Shortness of breath, productive cough  HPI: Sandra Wade is a 67 y.o. female with medical history significant for COPD, type 2 diabetes, hypertension, bipolar disorder, anxiety, chronic pain, and tobacco use who presents with shortness of breath and productive cough.  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  About 1 week ago she began to develop cough with progressive productive thick green sputum.  She had associated dyspnea requiring the use of her rescue inhaler, which she previously has not had to use.  She has some nausea without vomiting which has resolved.  She had lightheadedness without syncope or fall.  She denies any associated fevers, chills, diaphoresis, abdominal pain, diarrhea, constipation, dysuria.  She is a chronic smoker of 1.5 PPD cigarettes and also vapes, both of which she discontinued when her current symptoms began.  [facility] ED Course:  She initially presented to [facility] urgent care in [facility] at time point she was noted to be hypoxic at 73% on room air.  She was therefore sent to the [place] emergency department.    Initial vitals showed BP 173/99, pulse 87, RR 26, temp 98.4 F, SPO2 95% on 3 L O2 via [place]. Her labs were notable for WBC 12.2, serum glucose 441, and troponin I 0.06.  Chest x-ray showed diffuse bilateral pulmonary opacities suggestive of multifocal pneumonia.  Blood cultures were drawn and she was started on IV ceftriaxone and azithromycin.  She was also given IV Solu-Medrol 125 mg once and one albuterol nebulizer treatment.  She was subsequently transferred to [facility] for further management.  Review of Systems: As per HPI otherwise 10 point review of  systems negative.    Past Medical History:  Diagnosis Date  . Breast cancer ([place]) 1993  . Breast disorder 1993   Left breast cancer   . COPD (chronic obstructive pulmonary disease) ([place])   . Diabetes mellitus without complication ([place])   . Hypertension   . Mental disorder     Past Surgical History:  Procedure Laterality Date  . ABDOMINAL HYSTERECTOMY    . BACK SURGERY    . BREAST RECONSTRUCTION       reports that she has quit smoking. She has a 30.00 pack-year smoking history. She has never used smokeless tobacco. She reports current alcohol use. She reports that she does not use drugs.  No Known Allergies  Family History  Problem Relation Age of Onset  . Hypertension Mother   . Stroke Brother   . Cancer Neg Hx   . Diabetes Neg Hx      Prior to Admission medications   Medication Sig Start Date End Date Taking? Authorizing Provider  ALPRAZolam Duanne Moron) 1 MG tablet Take 1 mg by mouth at bedtime as needed for anxiety. 02/25/18  Yes [provider]  amLODipine (NORVASC) 10 MG tablet Take 10 mg by mouth daily.    [provider]  benazepril (LOTENSIN) 40 MG tablet Take 40 mg by mouth daily.    [provider]  cloNIDine (CATAPRES) 0.1 MG tablet Take 0.1 mg by mouth 2 (two) times daily.    [provider]  divalproex (DEPAKOTE) 500 MG DR tablet  06/23/16   [provider]  LANTUS SOLOSTAR 100 UNIT/ML  Solostar Pen  09/11/16   [provider]  levofloxacin (LEVAQUIN) 500 MG tablet  07/16/16   [provider]  ONE TOUCH ULTRA TEST test strip  09/11/16   [provider]  VICTOZA 18 MG/3ML SOPN  09/11/16   [provider]  zolpidem (AMBIEN) 10 MG tablet  07/31/16   [provider]    Physical Exam: Vitals:   03/02/18 1430 03/02/18 1442 03/02/18 1500 03/02/18 1834  BP: (!) 165/110 (!) 185/98 (!) 173/99 (!) 182/95  Pulse: 96 98  90  Resp: 19 (!) 25 (!) 25   Temp:  98 F (36.7 C)  98.1 F (36.7  C)  TempSrc:  Oral  Oral  SpO2: 95% 95%  98%  Weight:      Height:        Constitutional: Thin woman, NAD, calm, comfortable Eyes: PERRL, lids and conjunctivae normal ENMT: Mucous membranes are moist. Posterior pharynx clear of any exudate or lesions.Normal dentition.  Neck: normal, supple, no masses. Respiratory: Inspiratory crackles right lung field, no wheezing, normal respiratory effort. No accessory muscle use.  Cardiovascular: Regular rate and rhythm, no murmurs / rubs / gallops. No extremity edema. 2+ pedal pulses. Abdomen: no tenderness, no masses palpated. No hepatosplenomegaly. Bowel sounds positive.  Musculoskeletal: no clubbing / cyanosis. No joint deformity upper and lower extremities. Good ROM, no contractures. Normal muscle tone.  Skin: no rashes, lesions, ulcers. No induration Neurologic: CN 2-12 grossly intact. Sensation intact. Strength 5/5 in all 4.  Psychiatric: Normal judgment and insight. Alert and oriented x 3. Normal mood.    Labs on Admission: I have personally reviewed following labs and imaging studies  CBC: Recent Labs  Lab 03/02/18 1219  WBC 12.2*  NEUTROABS 9.8*  HGB 13.6  HCT 43.0  MCV 90.5  PLT 229   Basic Metabolic Panel: Recent Labs  Lab 03/02/18 1219  NA 134*  K 4.0  CL 90*  CO2 34*  GLUCOSE 441*  BUN 19  CREATININE 0.64  CALCIUM 9.5   GFR: Estimated Creatinine Clearance: 58.9 mL/min (by C-G formula based on SCr of 0.64 mg/dL). Liver Function Tests: No results for input(s): AST, ALT, ALKPHOS, BILITOT, PROT, ALBUMIN in the last 168 hours. No results for input(s): LIPASE, AMYLASE in the last 168 hours. No results for input(s): AMMONIA in the last 168 hours. Coagulation Profile: No results for input(s): INR, PROTIME in the last 168 hours. Cardiac Enzymes: Recent Labs  Lab 03/02/18 1219  TROPONINI 0.06*   BNP (last 3 results) No results for input(s): PROBNP in the last 8760 hours. HbA1C: No results for input(s): HGBA1C in  the last 72 hours. CBG: Recent Labs  Lab 03/02/18 1137 03/02/18 1829  GLUCAP 437* 435*   Lipid Profile: No results for input(s): CHOL, HDL, LDLCALC, TRIG, CHOLHDL, LDLDIRECT in the last 72 hours. Thyroid Function Tests: No results for input(s): TSH, T4TOTAL, FREET4, T3FREE, THYROIDAB in the last 72 hours. Anemia Panel: No results for input(s): VITAMINB12, FOLATE, FERRITIN, TIBC, IRON, RETICCTPCT in the last 72 hours. Urine analysis: No results found for: COLORURINE, APPEARANCEUR, LABSPEC, [place], GLUCOSEU, HGBUR, BILIRUBINUR, KETONESUR, PROTEINUR, UROBILINOGEN, NITRITE, LEUKOCYTESUR  Radiological Exams on Admission: Dg Chest 2 View  Result Date: 03/02/2018 CLINICAL DATA:  Cough for a week. EXAM: CHEST - 2 VIEW COMPARISON:  December 17, 2017 FINDINGS: The heart size is normal to borderline. No cardiomegaly. No pneumothorax. Diffuse bilateral pulmonary opacities are identified new. There is a right-sided pleural effusion versus pleural thickening which is stable. No  other interval changes. IMPRESSION: The diffuse bilateral pulmonary opacities are favored to represent diffuse multifocal pneumonia. Edema considered less likely. Recommend clinical correlation and short-term follow-up to ensure resolution. Electronically Signed   By: Dorise Bullion III M.D   On: 03/02/2018 12:37    EKG: Independently reviewed.  Sinus rhythm with PVC  Assessment/Plan Principal Problem:   CAP (community acquired pneumonia) Active Problems:   Diabetes mellitus without complication ([place])   COPD (chronic obstructive pulmonary disease) (H[facility])   Hypertension   Bipolar disorder ([place])   Tobacco use   Sandra Wade is a 67 y.o. female with medical history significant for COPD, type 2 diabetes, hypertension, bipolar disorder, anxiety, chronic pain, and tobacco use who presented to [facility] with shortness of breath, productive cough, and hypoxia.  Chest x-ray with multifocal pneumonia.  Community-acquired  pneumonia: Multifocal pneumonia seen on chest x-ray.  Requiring supplemental oxygen. -Continue IV ceftriaxone and azithromycin -Supplemental oxygen as needed, wean off as able -Follow-up blood cultures, strep pneumo and Legionella urine antigens  COPD: No active wheezing. -Albuterol nebulizers as needed  Type 2 diabetes: She is on Victoza and Lantus 24 units at home.  Blood sugar elevated on admission in setting of Solu-Medrol administration. -IV NovoLog once now -SSI and Lantus 15 units nightly, adjust as needed  Hypertension: Blood pressure elevated on arrival, has not taken her home meds today.  On amlodipine, benazepril, and clonidine at home. -Continue home meds  Bipolar disorder/anxiety: -Continue home Depakote, Zyprexa, and prn Alprazolam  Tobacco use: 1.5 PPD and vaping prior to onset of current symptoms. -Smoking cessation counseling  DVT prophylaxis: Lovenox Code Status: DNR, confirmed with patient Family Communication: No family present on admission Disposition Plan: Pending ability to wean off supplemental oxygen and ambulation without hypoxia Consults called: None Admission status: Inpatient   Zada Finders MD [facility] Pager [phone]  If 7PM-7AM, please contact night-coverage [email or web address] Password [email or web address]  03/02/2018, 8:04 PM

## 2018-03-02 NOTE — ED Provider Notes (Addendum)
[facility] EMERGENCY DEPARTMENT Provider Note   CSN: [id number] Arrival date & time: 03/02/18  1058     History   Chief Complaint Chief Complaint  Patient presents with  . Cough    HPI Sandra Wade is a 67 y.o. female.  Patient is a 67 year old female who presents with cough.  She has a history of diabetes, hypertension and COPD.  She uses an albuterol inhaler at home but states she has not really been compliant with her other inhalers.  She does not have a nebulizer machine.  She states she stopped smoking last week because she was having worsening coughing.  She states usually when she does that after a few days the coughing resolves and she feels better however this time she did not feel better and her coughing has gotten worse.  She was coughing up some white or clear phlegm but now she describes as yellow-green and pasty.  She denies any known fevers.  No chest pain.  She went to urgent care this morning and is found to be markedly hypoxic with an oxygen saturation in the 70s.  She was also noted to be hypoxic on room air in the emergency department on arrival.  She denies any leg pain or swelling.     Past Medical History:  Diagnosis Date  . Breast cancer ([place]) 1993  . Breast disorder 1993   Left breast cancer   . COPD (chronic obstructive pulmonary disease) ([place])   . Diabetes mellitus without complication ([place])   . Hypertension   . Mental disorder     There are no active problems to display for this patient.   Past Surgical History:  Procedure Laterality Date  . ABDOMINAL HYSTERECTOMY    . BACK SURGERY    . BREAST RECONSTRUCTION       OB History    Gravida  1   Para  0   Term      Preterm      AB  1   Living        SAB  1   TAB      Ectopic      Multiple      Live Births  0            Home Medications    Prior to Admission medications   Medication Sig Start Date End Date Taking? Authorizing Provider  amLODipine  (NORVASC) 10 MG tablet Take 10 mg by mouth daily.    [provider]  benazepril (LOTENSIN) 40 MG tablet Take 40 mg by mouth daily.    [provider]  clonazePAM Bobbye Charleston) 1 MG tablet  08/03/16   [provider]  cloNIDine (CATAPRES) 0.1 MG tablet Take 0.1 mg by mouth 2 (two) times daily.    [provider]  divalproex (DEPAKOTE) 500 MG DR tablet  06/23/16   [provider]  HYDROcodone-acetaminophen (NORCO) 7.5-325 MG tablet Take 1 tablet by mouth every 6 (six) hours as needed for moderate pain.    [provider]  LANTUS SOLOSTAR 100 UNIT/ML Solostar Pen  09/11/16   [provider]  levofloxacin (LEVAQUIN) 500 MG tablet  07/16/16   [provider]  ONE TOUCH ULTRA TEST test strip  09/11/16   [provider]  Trevose 18 MG/3ML SOPN  09/11/16   [provider]  zolpidem Lorrin Mais) 10 MG tablet  07/31/16   [provider]    Family History Family History  Problem Relation Age  of Onset  . Hypertension Mother   . Stroke Brother   . Cancer Neg Hx   . Diabetes Neg Hx     Social History Social History   Tobacco Use  . Smoking status: Former Smoker    Packs/day: 1.00    Years: 20.00    Pack years: 20.00  . Smokeless tobacco: Never Used  . Tobacco comment: stopped 7 days ago as of 03/02/18  Substance Use Topics  . Alcohol use: Yes  . Drug use: No     Allergies   Patient has no known allergies.   Review of Systems Review of Systems  Constitutional: Positive for fatigue. Negative for chills, diaphoresis and fever.  HENT: Negative for congestion, rhinorrhea and sneezing.   Eyes: Negative.   Respiratory: Positive for cough, shortness of breath and wheezing. Negative for chest tightness.   Cardiovascular: Negative for chest pain and leg swelling.  Gastrointestinal: Positive for nausea. Negative for abdominal pain, blood in stool, diarrhea and vomiting.  Genitourinary: Negative for  difficulty urinating, flank pain, frequency and hematuria.  Musculoskeletal: Negative for arthralgias and back pain.  Skin: Negative for rash.  Neurological: Negative for dizziness, speech difficulty, weakness, numbness and headaches.     Physical Exam Updated Vital Signs BP (!) 189/97   Pulse 96   Temp 98.4 F (36.9 C) (Oral)   Resp (!) 22   Ht 5\' 4"  (1.626 m)   Wt 56.7 kg   SpO2 94%   BMI 21.46 kg/m   Physical Exam Constitutional:      Appearance: She is well-developed.  HENT:     Head: Normocephalic and atraumatic.  Eyes:     Pupils: Pupils are equal, round, and reactive to light.  Neck:     Musculoskeletal: Normal range of motion and neck supple.  Cardiovascular:     Rate and Rhythm: Normal rate and regular rhythm.     Heart sounds: Normal heart sounds.  Pulmonary:     Effort: Pulmonary effort is normal. No respiratory distress.     Breath sounds: Wheezing present. No rales.     Comments: Tachypnea with mild accessory muscle use, diminished breath sounds bilaterally Chest:     Chest wall: No tenderness.  Abdominal:     General: Bowel sounds are normal.     Palpations: Abdomen is soft.     Tenderness: There is no abdominal tenderness. There is no guarding or rebound.  Musculoskeletal: Normal range of motion.        General: No swelling or tenderness.     Right lower leg: No edema.     Left lower leg: No edema.  Lymphadenopathy:     Cervical: No cervical adenopathy.  Skin:    General: Skin is warm and dry.     Findings: No rash.  Neurological:     Mental Status: She is alert and oriented to person, place, and time.      ED Treatments / Results  Labs (all labs ordered are listed, but only abnormal results are displayed) Labs Reviewed  BASIC METABOLIC PANEL - Abnormal; Notable for the following components:      Result Value   Sodium 134 (*)    Chloride 90 (*)    CO2 34 (*)    Glucose, Bld 441 (*)    All other components within normal limits  CBC  WITH DIFFERENTIAL/PLATELET - Abnormal; Notable for the following components:   WBC 12.2 (*)    Neutro Abs 9.8 (*)  Monocytes Absolute 1.2 (*)    Abs Immature Granulocytes 0.17 (*)    All other components within normal limits  TROPONIN I - Abnormal; Notable for the following components:   Troponin I 0.06 (*)    All other components within normal limits  CBG MONITORING, ED - Abnormal; Notable for the following components:   Glucose-Capillary 437 (*)    All other components within normal limits  CULTURE, BLOOD (ROUTINE X 2)  CULTURE, BLOOD (ROUTINE X 2)    EKG EKG Interpretation  Date/Time:  Saturday March 02 2018 11:32:33 EST Ventricular Rate:  85 PR Interval:    QRS Duration: 79 QT Interval:  393 QTC Calculation: 468 R Axis:   69 Text Interpretation:  Sinus rhythm Ventricular premature complex Consider left ventricular hypertrophy ST elevation, consider inferior injury Baseline wander in lead(s) V5 No old tracing to compare Confirmed by Malvin Johns [id number]) on 03/02/2018 1:47:55 PM   Radiology Dg Chest 2 View  Result Date: 03/02/2018 CLINICAL DATA:  Cough for a week. EXAM: CHEST - 2 VIEW COMPARISON:  December 17, 2017 FINDINGS: The heart size is normal to borderline. No cardiomegaly. No pneumothorax. Diffuse bilateral pulmonary opacities are identified new. There is a right-sided pleural effusion versus pleural thickening which is stable. No other interval changes. IMPRESSION: The diffuse bilateral pulmonary opacities are favored to represent diffuse multifocal pneumonia. Edema considered less likely. Recommend clinical correlation and short-term follow-up to ensure resolution. Electronically Signed   By: Dorise Bullion III M.D   On: 03/02/2018 12:37    Procedures Procedures (including critical care time)  Medications Ordered in ED Medications  cefTRIAXone (ROCEPHIN) 1 g in sodium chloride 0.9 % 100 mL IVPB (1 g Intravenous New Bag/Given 03/02/18 1348)  azithromycin  (ZITHROMAX) tablet 500 mg (500 mg Oral Given 03/02/18 1324)  0.9 %  sodium chloride infusion (250 mLs Intravenous New Bag/Given 03/02/18 1346)  albuterol (PROVENTIL) (2.5 MG/3ML) 0.083% nebulizer solution 5 mg (5 mg Nebulization Given 03/02/18 1159)  methylPREDNISolone sodium succinate (SOLU-MEDROL) 125 mg/2 mL injection 125 mg (125 mg Intravenous Given 03/02/18 1218)     Initial Impression / Assessment and Plan / ED Course  I have reviewed the triage vital signs and the nursing notes.  Pertinent labs & imaging results that were available during my care of the patient were reviewed by me and considered in my medical decision making (see chart for details).     Patient is a 67 year old female who presents with shortness of breath and cough.  Chest x-ray shows patchy bilateral pneumonia.  She was markedly hypoxic on arrival but is doing well on a nasal cannula.  It was initially on 6 L but we have started to trend that down.  She gets very short of breath with minimal movement and gets hypoxic with minimal movement.  She was treated in the ED with Solu-Medrol as well as nebulizer treatments.  She was started on antibiotics for community-acquired pneumonia.  She was given IV fluid boluses.  Her troponin is mildly elevated, but she has no SOB or significant ischemic changes on EKG.  I spoke with Dr. Broadus John with the hospitalist service at [facility] who will admit the patient for further treatment.  Patient request [facility] versus [place] [facility].  CRITICAL CARE Performed by: Malvin Johns Total critical care time: 60 minutes Critical care time was exclusive of separately billable procedures and treating other patients. Critical care was necessary to treat or prevent imminent or life-threatening deterioration. Critical care was time  spent personally by me on the following activities: development of treatment plan with patient and/or surrogate as well as nursing, discussions with consultants, evaluation  of patient's response to treatment, examination of patient, obtaining history from patient or surrogate, ordering and performing treatments and interventions, ordering and review of laboratory studies, ordering and review of radiographic studies, pulse oximetry and re-evaluation of patient's condition.   Final Clinical Impressions(s) / ED Diagnoses   Final diagnoses:  Community acquired pneumonia, unspecified laterality  COPD exacerbation [facility])    ED Discharge Orders    None       Malvin Johns, MD 03/02/18 1429    Malvin Johns, MD 03/02/18 1514    Malvin Johns, MD 03/02/18 [phone]

## 2018-03-02 NOTE — ED Triage Notes (Signed)
Pt was seen at [facility] today for cough x 1 wk; staff reported O2 sats of 73% and offered to call EMS for pt; pt refused, sts she had to go take care of her dog; called 911 for transport here; pt speaking in complete sentences continuously, no distress noted, but O2 sats 68-71% on RA here

## 2018-03-02 NOTE — ED Notes (Signed)
Pt placed on 02 6L [place] by RT

## 2018-03-03 DIAGNOSIS — J441 Chronic obstructive pulmonary disease with (acute) exacerbation: Secondary | ICD-10-CM

## 2018-03-03 DIAGNOSIS — Z720 Tobacco use: Secondary | ICD-10-CM

## 2018-03-03 LAB — HIV ANTIBODY (ROUTINE TESTING W REFLEX): HIV Screen 4th Generation wRfx: NONREACTIVE

## 2018-03-03 LAB — CBC
HCT: 44.4 % (ref 36.0–46.0)
HEMOGLOBIN: 14.5 g/dL (ref 12.0–15.0)
MCH: 29.1 pg (ref 26.0–34.0)
MCHC: 32.7 g/dL (ref 30.0–36.0)
MCV: 89.2 fL (ref 80.0–100.0)
Platelets: 314 10*3/uL (ref 150–400)
RBC: 4.98 MIL/uL (ref 3.87–5.11)
RDW: 12.9 % (ref 11.5–15.5)
WBC: 9.1 10*3/uL (ref 4.0–10.5)
nRBC: 0 % (ref 0.0–0.2)

## 2018-03-03 LAB — BASIC METABOLIC PANEL
Anion gap: 13 (ref 5–15)
BUN: 20 mg/dL (ref 8–23)
CO2: 36 mmol/L — ABNORMAL HIGH (ref 22–32)
Calcium: 9.4 mg/dL (ref 8.9–10.3)
Chloride: 88 mmol/L — ABNORMAL LOW (ref 98–111)
Creatinine, Ser: 0.8 mg/dL (ref 0.44–1.00)
GFR calc Af Amer: >60 mL/min (ref >60)
GFR calc non Af Amer: >60 mL/min (ref >60)
Glucose, Bld: 438 mg/dL — ABNORMAL HIGH (ref 70–99)
Potassium: 4 mmol/L (ref 3.5–5.1)
Sodium: 137 mmol/L (ref 135–145)

## 2018-03-03 LAB — GLUCOSE, CAPILLARY
Glucose-Capillary: 347 mg/dL — ABNORMAL HIGH (ref 70–99)
Glucose-Capillary: 428 mg/dL — ABNORMAL HIGH (ref 70–99)

## 2018-03-03 MED ORDER — INSULIN GLARGINE 100 UNIT/ML ~~LOC~~ SOLN
20.0000 [IU] | Freq: Every day | SUBCUTANEOUS | Status: DC
  Administered 2018-03-03: 20 [IU] via SUBCUTANEOUS
  Filled 2018-03-03: qty 0.2

## 2018-03-03 MED ORDER — INSULIN ASPART 100 UNIT/ML ~~LOC~~ SOLN: 0.0000 [IU] | Freq: Three times a day (TID) | SUBCUTANEOUS | Status: DC

## 2018-03-03 MED ORDER — POLYETHYLENE GLYCOL 3350 17 G PO PACK
17.0000 g | PACK | Freq: Every day | ORAL | Status: DC | PRN
  Administered 2018-03-03 – 2018-03-06 (×2): 17 g via ORAL
  Filled 2018-03-03 (×2): qty 1

## 2018-03-03 MED ORDER — INSULIN ASPART 100 UNIT/ML ~~LOC~~ SOLN
0.0000 [IU] | Freq: Three times a day (TID) | SUBCUTANEOUS | Status: DC
  Administered 2018-03-03: 20 [IU] via SUBCUTANEOUS

## 2018-03-03 MED ORDER — INSULIN ASPART 100 UNIT/ML ~~LOC~~ SOLN
0.0000 [IU] | Freq: Every day | SUBCUTANEOUS | Status: DC
  Administered 2018-03-04: 3 [IU] via SUBCUTANEOUS

## 2018-03-03 MED ORDER — METHADONE HCL 10 MG PO TABS
90.0000 mg | ORAL_TABLET | Freq: Every day | ORAL | Status: DC
  Administered 2018-03-03 – 2018-03-06 (×4): 90 mg via ORAL
  Filled 2018-03-03 (×4): qty 9

## 2018-03-03 MED ORDER — INSULIN ASPART 100 UNIT/ML ~~LOC~~ SOLN
25.0000 [IU] | Freq: Once | SUBCUTANEOUS | Status: AC
  Administered 2018-03-03: 25 [IU] via SUBCUTANEOUS

## 2018-03-03 MED ORDER — INSULIN ASPART 100 UNIT/ML ~~LOC~~ SOLN
0.0000 [IU] | Freq: Three times a day (TID) | SUBCUTANEOUS | Status: DC
  Administered 2018-03-04 – 2018-03-05 (×3): 3 [IU] via SUBCUTANEOUS
  Administered 2018-03-05 – 2018-03-06 (×2): 11 [IU] via SUBCUTANEOUS

## 2018-03-03 MED ORDER — INSULIN GLARGINE 100 UNIT/ML ~~LOC~~ SOLN
15.0000 [IU] | Freq: Once | SUBCUTANEOUS | Status: DC
  Filled 2018-03-03: qty 0.15

## 2018-03-03 MED ORDER — INSULIN GLARGINE 100 UNIT/ML ~~LOC~~ SOLN
25.0000 [IU] | Freq: Once | SUBCUTANEOUS | Status: AC
  Administered 2018-03-03: 25 [IU] via SUBCUTANEOUS
  Filled 2018-03-03: qty 0.25

## 2018-03-03 MED ORDER — INSULIN ASPART 100 UNIT/ML ~~LOC~~ SOLN
5.0000 [IU] | Freq: Once | SUBCUTANEOUS | Status: AC
  Administered 2018-03-03: 5 [IU] via SUBCUTANEOUS

## 2018-03-03 NOTE — Plan of Care (Signed)
Discussed with patient plan of care for the evening, pain and management and getting methadone added morning medications for her to discuss with MD with some teach back displayed

## 2018-03-03 NOTE — Plan of Care (Signed)
Discussed with patient plan of care for the evening, pain management and her problems with constipationn with some teach back displayed.  Patient takes miralax at home just about every night. Will page MD

## 2018-03-03 NOTE — Progress Notes (Signed)
PROGRESS NOTE    Sandra Wade  EYC:[id number] DOB: [birth year] DOA: 03/02/2018 PCP: Elisabeth Cara, PA-C  Brief Narrative: 67 year old female with past history of COPD, type 2 diabetes mellitus, chronic pain syndrome on methadone, hypertension, bipolar disorder, tobacco abuse presented to the emergency room with worsening shortness of breath cough congestion and wheezing. -Chest x-ray noted multifocal pneumonia  Assessment & Plan:   Community-acquired pneumonia: -Improving, continue IV azithromycin and ceftriaxone, not back to baseline yet  -Severe symptomatology and nausea limiting transition to oral antibiotics at this time  -Wean O2 -Follow-up blood cultures  COPD/respiratory failure No active wheezing. -Add duo nebs, scheduled and PRN  Type 2 diabetes/uncontrolled -Blood sugars in the 500s secondary to IV steroids given in the ED and orange juice consumption -Continue Lantus, increased dose to 25 units -Sliding scale insulin -Hold off on further steroid administration at this time  Chronic pain/multiple back surgeries -On methadone at baseline, will restart  Hypertension: -On amlodipine, benazepril, and clonidine at home. -Continue home meds  Bipolar disorder/anxiety: -Continue home Depakote, Zyprexa, and prn Alprazolam  Tobacco use: 1.5 PPD and vaping prior to onset of current symptoms. -Smoking cessation counseling  DVT prophylaxis: Lovenox Code Status: DNR, confirmed with patient Family Communication: No family present on admission Disposition Plan: Pending ability to wean off supplemental oxygen and ambulation without hypoxia  Consultants:      Procedures:   Antimicrobials:    Subjective: -Breathing better, not back to baseline yet, complains of nausea, cough congestion  -Complains of severe chronic back pain, blood sugars in the 500s this morning  Objective: Vitals:   03/02/18 1442 03/02/18 1500 03/02/18 1834 03/02/18 2145  BP:  (!) 185/98 (!) 173/99 (!) 182/95 (!) 176/92  Pulse: 98  90 87  Resp: (!) 25 (!) 25  18  Temp: 98 F (36.7 C)  98.1 F (36.7 C) 97.6 F (36.4 C)  TempSrc: Oral  Oral Oral  SpO2: 95%  98% 95%  Weight:      Height:        Intake/Output Summary (Last 24 hours) at 03/03/2018 1026 Last data filed at 03/03/2018 0400 Gross per 24 hour  Intake 1060 ml  Output 751 ml  Net 309 ml   Filed Weights   03/02/18 1117  Weight: 56.7 kg    Examination:  General exam: Frail cachectic chronically ill-appearing female sitting up in bed, uncomfortable appearing Respiratory system: Poor air movement bilaterally, scattered rhonchi Cardiovascular system: S1 & S2 heard, RRR. Gastrointestinal system: Abdomen is nondistended, soft and nontender.Normal bowel sounds heard. Central nervous system: Alert and oriented. No focal neurological deficits. Extremities: No edema Skin: No rashes, lesions or ulcers Psychiatry: Judgement and insight appear normal. Mood & affect appropriate.     Data Reviewed:   CBC: Recent Labs  Lab 03/02/18 1219 03/03/18 0320  WBC 12.2* 9.1  NEUTROABS 9.8*  --   HGB 13.6 14.5  HCT 43.0 44.4  MCV 90.5 89.2  PLT 282 149   Basic Metabolic Panel: Recent Labs  Lab 03/02/18 1219 03/03/18 0320  NA 134* 137  K 4.0 4.0  CL 90* 88*  CO2 34* 36*  GLUCOSE 441* 438*  BUN 19 20  CREATININE 0.64 0.80  CALCIUM 9.5 9.4   GFR: Estimated Creatinine Clearance: 58.9 mL/min (by C-G formula based on SCr of 0.8 mg/dL). Liver Function Tests: No results for input(s): AST, ALT, ALKPHOS, BILITOT, PROT, ALBUMIN in the last 168 hours. No results for input(s): LIPASE, AMYLASE in the last 168 hours.  No results for input(s): AMMONIA in the last 168 hours. Coagulation Profile: No results for input(s): INR, PROTIME in the last 168 hours. Cardiac Enzymes: Recent Labs  Lab 03/02/18 1219  TROPONINI 0.06*   BNP (last 3 results) No results for input(s): PROBNP in the last 8760  hours. HbA1C: No results for input(s): HGBA1C in the last 72 hours. CBG: Recent Labs  Lab 03/02/18 1137 03/02/18 1829 03/02/18 2132 03/03/18 0830  GLUCAP 437* 435* 585* 347*   Lipid Profile: No results for input(s): CHOL, HDL, LDLCALC, TRIG, CHOLHDL, LDLDIRECT in the last 72 hours. Thyroid Function Tests: No results for input(s): TSH, T4TOTAL, FREET4, T3FREE, THYROIDAB in the last 72 hours. Anemia Panel: No results for input(s): VITAMINB12, FOLATE, FERRITIN, TIBC, IRON, RETICCTPCT in the last 72 hours. Urine analysis: No results found for: COLORURINE, APPEARANCEUR, LABSPEC, PHURINE, GLUCOSEU, HGBUR, BILIRUBINUR, KETONESUR, PROTEINUR, UROBILINOGEN, NITRITE, LEUKOCYTESUR Sepsis Labs: @LABRCNTIP (procalcitonin:4,lacticidven:4)  )No results found for this or any previous visit (from the past 240 hour(s)).       Radiology Studies: Dg Chest 2 View  Result Date: 03/02/2018 CLINICAL DATA:  Cough for a week. EXAM: CHEST - 2 VIEW COMPARISON:  December 17, 2017 FINDINGS: The heart size is normal to borderline. No cardiomegaly. No pneumothorax. Diffuse bilateral pulmonary opacities are identified new. There is a right-sided pleural effusion versus pleural thickening which is stable. No other interval changes. IMPRESSION: The diffuse bilateral pulmonary opacities are favored to represent diffuse multifocal pneumonia. Edema considered less likely. Recommend clinical correlation and short-term follow-up to ensure resolution. Electronically Signed   By: Dorise Bullion III M.D   On: 03/02/2018 12:37        Scheduled Meds: . amLODipine  10 mg Oral Daily  . azithromycin  500 mg Oral Q24H  . benazepril  40 mg Oral Daily  . cloNIDine  0.1 mg Oral BID  . divalproex  500 mg Oral Daily  . enoxaparin (LOVENOX) injection  40 mg Subcutaneous Q24H  . insulin aspart  0-20 Units Subcutaneous TID WC  . insulin aspart  0-5 Units Subcutaneous QHS  . insulin glargine  20 Units Subcutaneous QHS  .  methadone  90 mg Oral Daily  . OLANZapine  10 mg Oral QHS   Continuous Infusions: . sodium chloride Stopped (03/02/18 1703)  . cefTRIAXone (ROCEPHIN)  IV Stopped (03/02/18 1859)     LOS: 1 day    Time spent: 60min    Domenic Polite, MD [facility] Page via [email or web address], password [email or web address] After 7PM please contact night-coverage  03/03/2018, 10:26 AM

## 2018-03-03 NOTE — Evaluation (Signed)
Physical Therapy Evaluation Patient Details Name: Sandra Wade MRN: [id number] DOB: [birth year] Today's Date: 03/03/2018   History of Present Illness  67 year old female with past history of COPD, type 2 diabetes mellitus, chronic pain syndrome on methadone, hypertension, bipolar disorder, tobacco abuse presented to the emergency room with worsening shortness of breath cough congestion and wheezing, found to have CAP    Clinical Impression  Pt admitted with above diagnosis. Pt currently with functional limitations due to the deficits listed below (see PT Problem List). PTA, pt living in 2 level home with her twin brother, avail 24/7, independent with all mobility but does not drive. Today pt ambulating 40' desatting on RA to 85%. Reports she feels weaker than baseline and would benefit from continued therapy to focus on improving activity tolerance. Will focus on stairs next visit and pt will be abel to proress to HHPT once medically ready for d/c.  Pt will benefit from skilled PT to increase their independence and safety with mobility to allow discharge to the venue listed below.       Follow Up Recommendations Home health PT;Supervision for mobility/OOB    Equipment Recommendations  None recommended by PT    Recommendations for Other Services       Precautions / Restrictions Precautions Precautions: Fall Restrictions Weight Bearing Restrictions: No      Mobility  Bed Mobility Overal bed mobility: Independent                Transfers Overall transfer level: Independent                  Ambulation/Gait Ambulation/Gait assistance: Supervision Gait Distance (Feet): 50 Feet Assistive device: None Gait Pattern/deviations: Step-through pattern Gait velocity: decreased   General Gait Details: pt reports feeling weaker than baseline, SpO2 down to 85% on RA, no symptoms.   Stairs            Wheelchair Mobility    Modified Rankin (Stroke Patients  Only)       Balance Overall balance assessment: No apparent balance deficits (not formally assessed)                                           Pertinent Vitals/Pain Pain Assessment: No/denies pain    Home Living Family/patient expects to be discharged to:: Private residence Living Arrangements: Other relatives(Brother) Available Help at Discharge: Family;Available 24 hours/day Type of Home: House Home Access: Level entry     Home Layout: Two level Home Equipment: None      Prior Function Level of Independence: Independent               Hand Dominance        Extremity/Trunk Assessment   Upper Extremity Assessment Upper Extremity Assessment: Overall WFL for tasks assessed    Lower Extremity Assessment Lower Extremity Assessment: Overall WFL for tasks assessed       Communication   Communication: No difficulties  Cognition Arousal/Alertness: Awake/alert Behavior During Therapy: WFL for tasks assessed/performed Overall Cognitive Status: Within Functional Limits for tasks assessed                                        General Comments      Exercises     Assessment/Plan    PT Assessment Patient  needs continued PT services  PT Problem List Decreased activity tolerance       PT Treatment Interventions      PT Goals (Current goals can be found in the Care Plan section)  Acute Rehab PT Goals Patient Stated Goal: return home when she is feeling better PT Goal Formulation: With patient Time For Goal Achievement: 03/17/18 Potential to Achieve Goals: Good    Frequency Min 3X/week   Barriers to discharge        Co-evaluation               AM-PAC PT "6 Clicks" Mobility  Outcome Measure Help needed turning from your back to your side while in a flat bed without using bedrails?: None Help needed moving from lying on your back to sitting on the side of a flat bed without using bedrails?: None Help needed  moving to and from a bed to a chair (including a wheelchair)?: None Help needed standing up from a chair using your arms (e.g., wheelchair or bedside chair)?: A Little Help needed to walk in hospital room?: A Little Help needed climbing 3-5 steps with a railing? : A Little 6 Click Score: 21    End of Session Equipment Utilized During Treatment: Gait belt Activity Tolerance: Patient tolerated treatment well Patient left: in bed;with call bell/phone within reach Nurse Communication: Mobility status PT Visit Diagnosis: Unsteadiness on feet (R26.81)    Time: 0349-1791 PT Time Calculation (min) (ACUTE ONLY): 25 min   Charges:   PT Evaluation $PT Eval Low Complexity: 1 Low PT Treatments $Gait Training: 8-22 mins        Reinaldo Berber, PT, DPT Acute Rehabilitation Services Pager: [phone] Office: [place] 03/03/2018, 4:45 PM

## 2018-03-04 DIAGNOSIS — E119 Type 2 diabetes mellitus without complications: Secondary | ICD-10-CM

## 2018-03-04 LAB — LEGIONELLA PNEUMOPHILA SEROGP 1 UR AG: L. pneumophila Serogp 1 Ur Ag: NEGATIVE

## 2018-03-04 LAB — CBC
HCT: 41.5 % (ref 36.0–46.0)
Hemoglobin: 12.7 g/dL (ref 12.0–15.0)
MCH: 28.2 pg (ref 26.0–34.0)
MCHC: 30.6 g/dL (ref 30.0–36.0)
MCV: 92.2 fL (ref 80.0–100.0)
PLATELETS: 341 10*3/uL (ref 150–400)
RBC: 4.5 MIL/uL (ref 3.87–5.11)
RDW: 13.5 % (ref 11.5–15.5)
WBC: 13.4 10*3/uL — ABNORMAL HIGH (ref 4.0–10.5)
nRBC: 0.1 % (ref 0.0–0.2)

## 2018-03-04 LAB — BASIC METABOLIC PANEL
Anion gap: 13 (ref 5–15)
BUN: 38 mg/dL — AB (ref 8–23)
CO2: 35 mmol/L — ABNORMAL HIGH (ref 22–32)
Calcium: 9.3 mg/dL (ref 8.9–10.3)
Chloride: 91 mmol/L — ABNORMAL LOW (ref 98–111)
Creatinine, Ser: 0.93 mg/dL (ref 0.44–1.00)
GFR calc Af Amer: >60 mL/min (ref >60)
GFR calc non Af Amer: >60 mL/min (ref >60)
Glucose, Bld: 141 mg/dL — ABNORMAL HIGH (ref 70–99)
Potassium: 3.8 mmol/L (ref 3.5–5.1)
SODIUM: 139 mmol/L (ref 135–145)

## 2018-03-04 LAB — GLUCOSE, CAPILLARY
GLUCOSE-CAPILLARY: 72 mg/dL (ref 70–99)
Glucose-Capillary: 98 mg/dL (ref 70–99)
Glucose-Capillary: 127 mg/dL — ABNORMAL HIGH (ref 70–99)
Glucose-Capillary: 90 mg/dL (ref 70–99)
Glucose-Capillary: 60 mg/dL — ABNORMAL LOW (ref 70–99)
Glucose-Capillary: 138 mg/dL — ABNORMAL HIGH (ref 70–99)
Glucose-Capillary: 266 mg/dL — ABNORMAL HIGH (ref 70–99)

## 2018-03-04 MED ORDER — CEFDINIR 300 MG PO CAPS
600.0000 mg | ORAL_CAPSULE | Freq: Every day | ORAL | Status: DC
  Administered 2018-03-04 – 2018-03-05 (×2): 600 mg via ORAL
  Filled 2018-03-04 (×4): qty 2

## 2018-03-04 MED ORDER — INSULIN GLARGINE 100 UNIT/ML ~~LOC~~ SOLN
15.0000 [IU] | Freq: Every day | SUBCUTANEOUS | Status: DC
  Administered 2018-03-04 – 2018-03-05 (×2): 15 [IU] via SUBCUTANEOUS
  Filled 2018-03-04 (×2): qty 0.15

## 2018-03-04 NOTE — Progress Notes (Signed)
Abx change:  Pt was admitted for CAP. She was started on azith/ceftriaxone. She is on D3 abx now. D/w on round to change abx to cefdinir to complete 7d. Scr <1, crcl 100ml/hr  Dc azith/ceftriaxone Cefdinir 600mg  PO qday x 4 more days  Onnie Boer, PharmD, [place], AAHIVP, CPP Infectious Disease Pharmacist 03/04/2018 12:00 PM

## 2018-03-04 NOTE — Progress Notes (Signed)
PROGRESS NOTE    Sandra Wade  VQQ:[id number] DOB: Jul 26, 1950 DOA: 03/02/2018 PCP: Elisabeth Cara, PA-C  Brief Narrative: 67 year old female with past history of COPD, type 2 diabetes mellitus, chronic pain syndrome on methadone, hypertension, bipolar disorder, tobacco abuse presented to the emergency room with worsening shortness of breath cough congestion and wheezing. -Chest x-ray noted multifocal pneumonia  Assessment & Plan:   Community-acquired pneumonia: -Improving, treated with IV azithromycin and ceftriaxone, not back to baseline yet  -change to oral cefdinir -blood Cx negative -Wean O2  COPD/respiratory failure -No active wheezing. -continue duo nebs, scheduled and PRN  Type 2 diabetes/uncontrolled -CBGs better, continue lantus lower dose -Hold off on further steroid administration at this time  Chronic pain/multiple back surgeries -On methadone at baseline, resume  Hypertension: -On amlodipine, benazepril, and clonidine at home. -Continue home meds  Bipolar disorder/anxiety: -Continue home Depakote, Zyprexa, and prn Alprazolam  Tobacco use: 1.5 PPD and vaping prior to onset of current symptoms. -Smoking cessation counseling  DVT prophylaxis: Lovenox Code Status: DNR, confirmed with patient Family Communication: No family present on admission Disposition Plan: home tomorrow if stable  Consultants:      Procedures:   Antimicrobials:    Subjective: -doing better, breathing improving  Objective: Vitals:   03/03/18 1550 03/03/18 1830 03/03/18 2143 03/04/18 0947  BP: 116/67 119/69 97/69 132/77  Pulse: (!) 103 72 69 71  Resp:      Temp: (!) 101.7 F (38.7 C) 97.9 F (36.6 C) 98.2 F (36.8 C) (!) 97.5 F (36.4 C)  TempSrc:   Oral Oral  SpO2: 95% 93% 95% 97%  Weight:      Height:        Intake/Output Summary (Last 24 hours) at 03/04/2018 1240 Last data filed at 03/03/2018 2146 Gross per 24 hour  Intake 480 ml  Output  900 ml  Net -420 ml   Filed Weights   03/02/18 1117  Weight: 56.7 kg    Examination:  Gen: frail, cachectic female, sitting up in bed HEENT: PERRLA, Neck supple, no JVD Lungs: Improved air movement, rhonchi in both bases CVS: RRR,No Gallops,Rubs or new Murmurs Abd: soft, Non tender, non distended, BS present Extremities: No edema Skin: no new rashes Psychiatry: Judgement and insight appear normal. Mood & affect appropriate.     Data Reviewed:   CBC: Recent Labs  Lab 03/02/18 1219 03/03/18 0320 03/04/18 0207  WBC 12.2* 9.1 13.4*  NEUTROABS 9.8*  --   --   HGB 13.6 14.5 12.7  HCT 43.0 44.4 41.5  MCV 90.5 89.2 92.2  PLT 282 314 433   Basic Metabolic Panel: Recent Labs  Lab 03/02/18 1219 03/03/18 0320 03/04/18 0207  NA 134* 137 139  K 4.0 4.0 3.8  CL 90* 88* 91*  CO2 34* 36* 35*  GLUCOSE 441* 438* 141*  BUN 19 20 38*  CREATININE 0.64 0.80 0.93  CALCIUM 9.5 9.4 9.3   GFR: Estimated Creatinine Clearance: 50.7 mL/min (by C-G formula based on SCr of 0.93 mg/dL). Liver Function Tests: No results for input(s): AST, ALT, ALKPHOS, BILITOT, PROT, ALBUMIN in the last 168 hours. No results for input(s): LIPASE, AMYLASE in the last 168 hours. No results for input(s): AMMONIA in the last 168 hours. Coagulation Profile: No results for input(s): INR, PROTIME in the last 168 hours. Cardiac Enzymes: Recent Labs  Lab 03/02/18 1219  TROPONINI 0.06*   BNP (last 3 results) No results for input(s): PROBNP in the last 8760 hours. HbA1C: No results for  input(s): HGBA1C in the last 72 hours. CBG: Recent Labs  Lab 03/03/18 1229 03/03/18 1726 03/03/18 2017 03/04/18 0944 03/04/18 1207  GLUCAP 428* 72 98 127* 90   Lipid Profile: No results for input(s): CHOL, HDL, LDLCALC, TRIG, CHOLHDL, LDLDIRECT in the last 72 hours. Thyroid Function Tests: No results for input(s): TSH, T4TOTAL, FREET4, T3FREE, THYROIDAB in the last 72 hours. Anemia Panel: No results for input(s):  VITAMINB12, FOLATE, FERRITIN, TIBC, IRON, RETICCTPCT in the last 72 hours. Urine analysis: No results found for: COLORURINE, APPEARANCEUR, LABSPEC, PHURINE, GLUCOSEU, HGBUR, BILIRUBINUR, KETONESUR, [place], UROBILINOGEN, NITRITE, LEUKOCYTESUR Sepsis Labs: @LABRCNTIP (procalcitonin:4,lacticidven:4)  ) Recent Results (from the past 240 hour(s))  Culture, blood (routine x 2) Call MD if unable to obtain prior to antibiotics being given     Status: None (Preliminary result)   Collection Time: 03/02/18  1:34 PM  Result Value Ref Range Status   Specimen Description   Final    BLOOD RIGHT FOREARM Performed at [facility], [address] [place], [place] [address]    Special Requests   Final    BOTTLES DRAWN AEROBIC AND ANAEROBIC Blood Culture results may not be optimal due to an inadequate volume of blood received in culture bottles Performed at [facility], [address] [place], [place] [address]    Culture   Final    NO GROWTH 2 DAYS Performed at [facility], [place] [address], [place], [place] [address]    Report Status PENDING  Incomplete  Culture, blood (routine x 2) Call MD if unable to obtain prior to antibiotics being given     Status: None (Preliminary result)   Collection Time: 03/02/18  1:41 PM  Result Value Ref Range Status   Specimen Description   Final    BLOOD RIGHT HAND Performed at [facility], [address] [place], [place] [address]    Special Requests   Final    BOTTLES DRAWN AEROBIC AND ANAEROBIC Blood Culture results may not be optimal due to an inadequate volume of blood received in culture bottles Performed at [facility], [address] [place], [place] [address]    Culture   Final    NO GROWTH 2 DAYS Performed at [facility], [place] [address], [place], [place] [address]    Report Status PENDING  Incomplete         Radiology Studies: No results found.      Scheduled Meds: .  amLODipine  10 mg Oral Daily  . benazepril  40 mg Oral Daily  . cefdinir  600 mg Oral Daily  . cloNIDine  0.1 mg Oral BID  . divalproex  500 mg Oral Daily  . enoxaparin (LOVENOX) injection  40 mg Subcutaneous Q24H  . insulin aspart  0-20 Units Subcutaneous TID WC  . insulin aspart  0-5 Units Subcutaneous QHS  . insulin glargine  15 Units Subcutaneous QHS  . methadone  90 mg Oral Daily  . OLANZapine  10 mg Oral QHS   Continuous Infusions: . sodium chloride Stopped (03/02/18 1703)     LOS: 2 days    Time spent: 88min    Domenic Polite, MD [facility] Page via [email or web address], password [email or web address] After 7PM please contact night-coverage  03/04/2018, 12:40 PM

## 2018-03-04 NOTE — Progress Notes (Addendum)
Inpatient Diabetes Program Recommendations  AACE/ADA: New Consensus Statement on Inpatient Glycemic Control (2015)  Target Ranges:  Prepandial:   less than 140 mg/dL      Peak postprandial:   less than 180 mg/dL (1-2 hours)      Critically ill patients:  140 - 180 mg/dL   Results for [name], [name] (MRN [id number]) as of 03/04/2018 09:04  Ref. Range 03/02/2018 11:37 03/02/2018 18:29 03/02/2018 21:32 03/03/2018 08:30 03/03/2018 12:29 03/03/2018 17:26 03/03/2018 20:17  Glucose-Capillary Latest Ref Range: 70 - 99 mg/dL 437 (H)     125 mg IV Solumedrol at 12:18pm 435 (H) 585 (HH)  15 units NOVOLOG +  15 units LANTUS 347 (H)     25 units LANTUS at 9am 428 (H)  45 units NOVOLOG  72 98     20 units LANTUS   Results for [name], [name] (MRN [id number]) as of 03/04/2018 13:44  Ref. Range 03/04/2018 09:44 03/04/2018 12:07  Glucose-Capillary Latest Ref Range: 70 - 99 mg/dL 127 (H) 90    Admit with: Pneumonia/ Glucose 441 mg/dl  History: DM, COPD  Home DM Meds: Lantus 10 units QHS       Victoza 1.8 mg Daily  Current Orders: Lantus 20 units QHS      Novolog Resistant Correction Scale/ SSI (0-20 units) TID AC + HS      Received 125 mg Solumedrol in the ED on 12/14 and CBGs rose to 585 mg/dl by 9am that night.  Patient was given several doses of Novolog and Lantus and CBGs came down by late yesterday afternoon.  Per nursing, "Patient tends to just take her medications and not check her blood glucose levels."    Addendum 1:40pm-- Attempted to speak with patient today about her home diabetes regimen and care.  Pt was very sleepy.  Could not answer any of my questions and could not keep her eyes open to speak with me.  Will attempt to revisit with pt tomorrow.  Alerted pt's RN to this as well.    --Will follow patient during hospitalization--  Wyn Quaker RN, MSN, CDE Diabetes Coordinator Inpatient Glycemic Control Team Team Pager: [phone]  (8a-5p)

## 2018-03-04 NOTE — Plan of Care (Signed)
Discussed with patient plan of care for the evening, anxiety medication decrease due to drowsiness and blood glucose being low with some teach back displayed

## 2018-03-05 LAB — GLUCOSE, CAPILLARY
Glucose-Capillary: 147 mg/dL — ABNORMAL HIGH (ref 70–99)
Glucose-Capillary: 140 mg/dL — ABNORMAL HIGH (ref 70–99)
Glucose-Capillary: 130 mg/dL — ABNORMAL HIGH (ref 70–99)
Glucose-Capillary: 293 mg/dL — ABNORMAL HIGH (ref 70–99)
Glucose-Capillary: 165 mg/dL — ABNORMAL HIGH (ref 70–99)

## 2018-03-05 MED ORDER — CLONIDINE HCL 0.1 MG PO TABS: 0.1000 mg | ORAL_TABLET | Freq: Every day | ORAL | Status: AC

## 2018-03-05 MED ORDER — CLONIDINE HCL 0.1 MG PO TABS
0.1000 mg | ORAL_TABLET | Freq: Every day | ORAL | Status: DC
  Administered 2018-03-06: 0.1 mg via ORAL
  Filled 2018-03-05: qty 1

## 2018-03-05 MED ORDER — CEFDINIR 300 MG PO CAPS: 600.0000 mg | ORAL_CAPSULE | Freq: Every day | ORAL | 0 refills | Status: AC

## 2018-03-05 MED ORDER — LINACLOTIDE 145 MCG PO CAPS
145.0000 ug | ORAL_CAPSULE | Freq: Every day | ORAL | Status: DC
  Administered 2018-03-06: 145 ug via ORAL
  Filled 2018-03-05: qty 1

## 2018-03-05 NOTE — Progress Notes (Addendum)
Inpatient Diabetes Program Recommendations  AACE/ADA: New Consensus Statement on Inpatient Glycemic Control (2015)  Target Ranges:  Prepandial:   less than 140 mg/dL      Peak postprandial:   less than 180 mg/dL (1-2 hours)      Critically ill patients:  140 - 180 mg/dL   Results for Sandra Wade, Sandra Wade (MRN [id number]) as of 03/05/2018 07:31  Ref. Range 03/04/2018 09:44 03/04/2018 12:07 03/04/2018 17:37 03/04/2018 18:37 03/04/2018 21:33  Glucose-Capillary Latest Ref Range: 70 - 99 mg/dL 127 (H)  3 units NOVOLOG  90 60 (L) 138 (H) 266 (H)  3 units NOVOLOG +  15 units LANTUS   Results for Sandra Wade, Sandra Wade (MRN [id number]) as of 03/05/2018 08:20  Ref. Range 03/05/2018 07:38  Glucose-Capillary Latest Ref Range: 70 - 99 mg/dL 147 (H)     Admit with: Pneumonia/ Glucose 441 mg/dl  History: DM, COPD  Home DM Meds: Lantus 10 units QHS (patient states she is taking Lantus 24 units QHS)                             Victoza 1.8 mg Daily  Current Orders: Lantus 15 units QHS                            Novolog Resistant Correction Scale/ SSI (0-20 units) TID AC + HS      MD- Note patient with Mild Hypoglycemia yesterday at 5:37pm.    Please consider the following adjustments:  1. Reduce Lantus slightly to 12 units QHS  2. Reduce Novolog SSI to Moderate scale (0-15 units) TID AC + HS     Addendum 10:30am- Met with pt this AM.  Pt told me she rarely checks her CBGs.  Does not eat well at home.  Inquired about how to apply for meals on wheels at home.  States she is taking Victoza 1.8 mg Daily + Lantus 24 units QHS.  Sees Dr. Anselm Pancoast ([facility]) for regular medical care.  Told me that a diabetes specialist physician's office has left her a message about making an appointment (stated her PCP made the referral to an ENDO).  Has yet to make the appt.  Does have CBG meter at home but not checking on regular basis.  Asked me why her CBGs were so high on admission  despite her not having anything to eat.  Discussed with pt that her liver may be overproducing glucose and that her current infection (pneumonia) is also likely contributing to her high CBGs).    Encouraged pt to check her CBGs TID AC at home for the next few weeks and to keep a log of all the CBGs.  Also encouraged pt to make an appt with the ENDO office that has contacted her.  Reminded pt that it is imperative she get her CBGs under control to prevent any further long-term damage to her organs.  Pt stated she wants to take better care of herself and plans to call the ENDO office soon after d/c.      --Will follow patient during hospitalization--  Wyn Quaker RN, MSN, CDE Diabetes Coordinator Inpatient Glycemic Control Team Team Pager: [phone] (8a-5p)

## 2018-03-05 NOTE — Progress Notes (Signed)
PT Cancellation Note  Patient Details Name: Sandra Wade MRN: [id number] DOB: 1950-12-24   Cancelled Treatment:    Reason Eval/Treat Not Completed: Other (comment). Pt is dressed and leaving imminently so asked PT not to see her.  Will follow up with her tomorrow if dc is held for some reason.   Ramond Dial 03/05/2018, 3:32 PM   Mee Hives, PT MS Acute Rehab Dept. Number: [place]

## 2018-03-05 NOTE — Discharge Summary (Signed)
Physician Discharge Summary  Sandra Wade [id number] DOB: 1950/07/15 DOA: 03/02/2018  PCP: Elisabeth Cara, PA-C  Admit date: 03/02/2018 Discharge date: 03/05/2018  Time spent: 45 minutes  Recommendations for Outpatient Follow-up:  1. PCP in 1 week 2. CXR in 4-6weeks 3. Home health PT and RN   Discharge Diagnoses:  Principal Problem:   CAP (community acquired pneumonia)   Chronic pain   Diabetes mellitus without complication ([place])   COPD exacerbation ([place])   Hypertension   Bipolar disorder ([place])   Tobacco use   Discharge Condition: stable  Diet recommendation: low sodium  Filed Weights   03/02/18 1117  Weight: 56.7 kg    History of present illness:   67 year old female with past history of COPD, type 2 diabetes mellitus, chronic pain syndrome on methadone, hypertension, bipolar disorder, tobacco abuse presented to the emergency room with worsening shortness of breath cough congestion and wheezing. -Chest x-ray noted multifocal pneumonia  Hospital Course:   Community-acquired pneumonia: -Improving, treated with IV azithromycin and ceftriaxone, not back to baseline yet  -change to oral cefdinir -blood Cx negative -Weaned O2 down to 2L -discharge home today, set with up home health PT and RN  COPD/respiratory failure -No active wheezing. -continue duo nebs, scheduled and PRN  Type 2 diabetes/uncontrolled -CBGs better, continue lantus   Chronic pain/multiple back surgeries -On methadone at baseline, resumed  Hypertension: -On amlodipine, benazepril, and clonidine at home. -Continue home meds  Bipolar disorder/anxiety: -Continue home Depakote, Zyprexa, and prn Alprazolam  Tobacco use: 1.5 PPD and vapingprior to onset of current symptoms. -Smoking cessation counseled  Code Status:DNR, confirmed with patient  Discharge Exam: Vitals:   03/05/18 1202 03/05/18 1639  BP: 139/82 (!) 148/83  Pulse: 77 79  Resp: 20 19  Temp: 98.1  F (36.7 C) 98.3 F (36.8 C)  SpO2: 90% 91%    General: AAOx3 Cardiovascular: S1S2/RRR Respiratory: improved BS, no wheezes  Discharge Instructions   Discharge Instructions    Diet - low sodium heart healthy   Complete by:  As directed    Increase activity slowly   Complete by:  As directed      Allergies as of 03/05/2018   No Known Allergies     Medication List    TAKE these medications   ALPRAZolam 1 MG tablet Commonly known as:  XANAX Take 1 mg by mouth at bedtime as needed for anxiety.   amLODipine 10 MG tablet Commonly known as:  NORVASC Take 10 mg by mouth daily.   benazepril 40 MG tablet Commonly known as:  LOTENSIN Take 40 mg by mouth daily.   cefdinir 300 MG capsule Commonly known as:  OMNICEF Take 2 capsules (600 mg total) by mouth daily for 4 days. Start taking on:  March 06, 2018   cloNIDine 0.1 MG tablet Commonly known as:  CATAPRES Take 1 tablet (0.1 mg total) by mouth at bedtime. What changed:  when to take this   divalproex 500 MG DR tablet Commonly known as:  DEPAKOTE Take 500 mg by mouth 2 (two) times daily.   LANTUS SOLOSTAR 100 UNIT/ML Solostar Pen Generic drug:  Insulin Glargine Inject 10 Units into the skin at bedtime.   methadone 10 MG tablet Commonly known as:  DOLOPHINE Take 90 mg by mouth daily.   OLANZapine 10 MG tablet Commonly known as:  ZYPREXA Take 10 mg by mouth at bedtime.   ONE TOUCH ULTRA TEST test strip Generic drug:  glucose blood   VICTOZA 18 MG/3ML Sopn  Generic drug:  liraglutide Inject 18 mg into the skin every morning.      No Known Allergies Follow-up Information    [place], [place], [place]. Schedule an appointment as soon as possible for a visit in 1 week(s).   Specialty:  Family Medicine Contact information: [address] [place] [address] [place] Follow up.   Specialty:  [place] Why:  HHRN,HHPT Contact  information: [address] [place] [facility] [address] [phone]            The results of significant diagnostics from this hospitalization (including imaging, microbiology, ancillary and laboratory) are listed below for reference.    Significant Diagnostic Studies: Dg Chest 2 View  Result Date: 03/02/2018 CLINICAL DATA:  Cough for a week. EXAM: CHEST - 2 VIEW COMPARISON:  December 17, 2017 FINDINGS: The heart size is normal to borderline. No cardiomegaly. No pneumothorax. Diffuse bilateral pulmonary opacities are identified new. There is a right-sided pleural effusion versus pleural thickening which is stable. No other interval changes. IMPRESSION: The diffuse bilateral pulmonary opacities are favored to represent diffuse multifocal pneumonia. Edema considered less likely. Recommend clinical correlation and short-term follow-up to ensure resolution. Electronically Signed   By: Dorise Bullion III M.D   On: 03/02/2018 12:37    Microbiology: Recent Results (from the past 240 hour(s))  Culture, blood (routine x 2) Call MD if unable to obtain prior to antibiotics being given     Status: None (Preliminary result)   Collection Time: 03/02/18  1:34 PM  Result Value Ref Range Status   Specimen Description   Final    BLOOD RIGHT FOREARM Performed at [facility], [address] [place], [place] [address]    Special Requests   Final    BOTTLES DRAWN AEROBIC AND ANAEROBIC Blood Culture results may not be optimal due to an inadequate volume of blood received in culture bottles Performed at [facility], [address] [place], [place] [address]    Culture   Final    NO GROWTH 3 DAYS Performed at [facility], [place] [address] [place], [place] [address]    Report Status PENDING  Incomplete  Culture, blood (routine x 2) Call MD if unable to obtain prior to antibiotics being given     Status: None (Preliminary result)   Collection Time: 03/02/18  1:41 PM   Result Value Ref Range Status   Specimen Description   Final    BLOOD RIGHT HAND Performed at [facility], [address] [place], [place] [address]    Special Requests   Final    BOTTLES DRAWN AEROBIC AND ANAEROBIC Blood Culture results may not be optimal due to an inadequate volume of blood received in culture bottles Performed at [facility], [address] [place], [place] [address]    Culture   Final    NO GROWTH 3 DAYS Performed at [facility], [place] [address] [place], [place] [address]    Report Status PENDING  Incomplete     Labs: Basic Metabolic Panel: Recent Labs  Lab 03/02/18 1219 03/03/18 0320 03/04/18 0207  NA 134* 137 139  K 4.0 4.0 3.8  CL 90* 88* 91*  CO2 34* 36* 35*  GLUCOSE 441* 438* 141*  BUN 19 20 38*  CREATININE 0.64 0.80 0.93  CALCIUM 9.5 9.4 9.3  Liver Function Tests: No results for input(s): AST, ALT, ALKPHOS, BILITOT, PROT, ALBUMIN in the last 168 hours. No results for input(s): LIPASE, AMYLASE in the last 168 hours. No results for input(s): AMMONIA in the last 168 hours. CBC: Recent Labs  Lab 03/02/18 1219 03/03/18 0320 03/04/18 0207  WBC 12.2* 9.1 13.4*  NEUTROABS 9.8*  --   --   HGB 13.6 14.5 12.7  HCT 43.0 44.4 41.5  MCV 90.5 89.2 92.2  PLT 282 314 341   Cardiac Enzymes: Recent Labs  Lab 03/02/18 1219  TROPONINI 0.06*   BNP: BNP (last 3 results) No results for input(s): BNP in the last 8760 hours.  ProBNP (last 3 results) No results for input(s): PROBNP in the last 8760 hours.  CBG: Recent Labs  Lab 03/04/18 2133 03/05/18 0738 03/05/18 1201 03/05/18 1306 03/05/18 1636  GLUCAP 266* 147* 140* 130* 293*       Signed:  Domenic Polite MD.  [facility] 03/05/2018, 4:59 PM

## 2018-03-05 NOTE — Consult Note (Signed)
            [name] CM Primary Care Navigator  03/05/2018  Sandra Wade February 15, 1951 [id number]   Seenpatientat the bedsidetoidentify possible discharge needs.  Patientstates that she "couldn't breath" that resulted to this admission. (community acquired pneumonia, COPD exacerbation, acute respiratory failure, DM-uncontrolled, HTN, tobacco abuse/ vaping)  Patientreports that herprimary care providerisDr. Anselm Pancoast with [facility] (not a [facility] provider and not affiliated with [facility] or [place]).   Anticipated discharge plan is home with home health services per Inpatient CM note.  Patient was encouraged to follow-up with her primary care provider when she returns back home in order to get help manage her health issues/ needs.Patient reminder letter provided. Patient was followed by Inpatient DM coordinator during this admission.   For additional questions please contact:  Edwena Felty A. Terrill Alperin, BSN, RN-BC [facility] PRIMARY CARE Navigator Cell: [phone]

## 2018-03-05 NOTE — Care Management Note (Addendum)
Case Management Note  Patient Details  Name: Sandra Wade MRN: [id number] Date of Birth: 1950/03/29  Subjective/Objective:  For dc today, NCM offered choice, she chose [facility] from agency list, referral given to [facility] with [facility], for [facility], HHPT, soc will begin 24-48 hrs post dc.  Patient states her phone is [phone].                  Action/Plan: DC home when ready.  Expected Discharge Date:  03/05/18               Expected Discharge Plan:  [place]  In-House Referral:     Discharge planning Services  CM Consult  Post Acute Care Choice:  Home Health Choice offered to:  Patient  DME Arranged:   oxygen, walker rolling DME [facility]   [facility]  HH Arranged:  RN, Disease Management, PT [place] Agency:  [place]  Status of Service:  Completed, signed off  If discussed at [place] of Stay Meetings, dates discussed:    Additional Comments:  Zenon Mayo, RN 03/05/2018, 1:20 PM

## 2018-03-06 LAB — GLUCOSE, CAPILLARY: Glucose-Capillary: 300 mg/dL — ABNORMAL HIGH (ref 70–99)

## 2018-03-06 MED ORDER — PROMETHAZINE HCL 25 MG/ML IJ SOLN
12.5000 mg | Freq: Once | INTRAMUSCULAR | Status: AC
  Administered 2018-03-06: 12.5 mg via INTRAVENOUS
  Filled 2018-03-06: qty 1

## 2018-03-06 MED ORDER — OXYCODONE HCL 5 MG PO TABS
10.0000 mg | ORAL_TABLET | Freq: Once | ORAL | Status: AC
  Administered 2018-03-06: 10 mg via ORAL
  Filled 2018-03-06: qty 2

## 2018-03-06 NOTE — Progress Notes (Addendum)
Physical Therapy Treatment Patient Details Name: Sandra Wade MRN: [id number] DOB: [birth year] Today's Date: 03/06/2018    History of Present Illness 66 year old female with past history of COPD, type 2 diabetes mellitus, chronic pain syndrome on methadone, hypertension, bipolar disorder, tobacco abuse presented to the emergency room with worsening shortness of breath cough congestion and wheezing, foud to have PNA    PT Comments    Patient more lethargic and less alert this session, discussed with RN, recently received methadone. Patient desatting on RA to 60s with good pleth, 3L keeps patient in 90s with activity. (see ambulation home O2 note). Patient markedly unsteady and unsafe to ambulate al;one at this time, unlike last PT session where patient walking safely. Patient states she will have her friend driving her and lives with her brother. Stressed importance of supervision to patient who verbalizes understanding. HR 63max during visit. Discussed concerns for patients current state with RN and case Freight forwarder. Unable to safely work with stairs this visit.     Follow Up Recommendations  Home health PT;Supervision for mobility/OOB     Equipment Recommendations  2W Rolling Walker    Recommendations for Other Services       Precautions / Restrictions Precautions Precautions: Fall Restrictions Weight Bearing Restrictions: No    Mobility  Bed Mobility Overal bed mobility: Independent                Transfers Overall transfer level: Independent                  Ambulation/Gait Ambulation/Gait assistance: Min assist Gait Distance (Feet): 40 Feet Assistive device: None Gait Pattern/deviations: Step-through pattern Gait velocity: decreased  Patient ambulating with unsteady gait, intermittent need for UE support and external support to prevent LOB. Patient lethargic this visit.        Stairs             Wheelchair Mobility    Modified Rankin  (Stroke Patients Only)       Balance Overall balance assessment: No apparent balance deficits (not formally assessed)                                          Cognition Arousal/Alertness: Awake/alert Behavior During Therapy: WFL for tasks assessed/performed Overall Cognitive Status: Within Functional Limits for tasks assessed                                        Exercises      General Comments        Pertinent Vitals/Pain Pain Assessment: No/denies pain    Home Living                      Prior Function            PT Goals (current goals can now be found in the care plan section) Acute Rehab PT Goals Patient Stated Goal: return home when she is feeling better PT Goal Formulation: With patient Time For Goal Achievement: 03/17/18 Potential to Achieve Goals: Good Progress towards PT goals: Not progressing toward goals - comment(pt recently given methadone per RN, lethargic. )    Frequency    Min 3X/week      PT Plan Current plan remains appropriate    Co-evaluation  AM-PAC PT "6 Clicks" Mobility   Outcome Measure  Help needed turning from your back to your side while in a flat bed without using bedrails?: None Help needed moving from lying on your back to sitting on the side of a flat bed without using bedrails?: None Help needed moving to and from a bed to a chair (including a wheelchair)?: A Little Help needed standing up from a chair using your arms (e.g., wheelchair or bedside chair)?: A Lot Help needed to walk in hospital room?: A Lot Help needed climbing 3-5 steps with a railing? : A Lot 6 Click Score: 17    End of Session Equipment Utilized During Treatment: Gait belt Activity Tolerance: Patient tolerated treatment well Patient left: in bed;with call bell/phone within reach Nurse Communication: Mobility status PT Visit Diagnosis: Unsteadiness on feet (R26.81)     Time:  7615-1834 PT Time Calculation (min) (ACUTE ONLY): 25 min  Charges:  $Gait Training: 8-22 mins $Therapeutic Activity: 8-22 mins                     Reinaldo Berber, PT, DPT Acute Rehabilitation Services Pager: [phone] Office: [place] 03/06/2018, 9:45 AM

## 2018-03-06 NOTE — Discharge Summary (Signed)
Physician Discharge Summary  Sandra Wade [id number] DOB: 02/05/1951 DOA: 03/02/2018  PCP: Elisabeth Cara, PA-C  Admit date: 03/02/2018 Discharge date: 03/06/2018  Time spent: 45 minutes  Recommendations for Outpatient Follow-up:  1. PCP in 1 week 2. CXR in 4-6weeks 3. Home health PT and RN 4. Highly recommend to cut down on psychotropic medication,.    Discharge Diagnoses:  Principal Problem:   CAP (community acquired pneumonia)   Chronic pain   Diabetes mellitus without complication ([place])   COPD exacerbation ([place])   Hypertension   Bipolar disorder ([place])   Tobacco use   Discharge Condition: stable  Diet recommendation: low sodium  Filed Weights   03/02/18 1117  Weight: 56.7 kg    History of present illness:   67 year old female with past history of COPD, type 2 diabetes mellitus, chronic pain syndrome on methadone, hypertension, bipolar disorder, tobacco abuse presented to the emergency room with worsening shortness of breath cough congestion and wheezing. -Chest x-ray noted multifocal pneumonia  Hospital Course:   Community-acquired pneumonia: -Improving, treated with IV azithromycin and ceftriaxone, not back to baseline yet  -change to oral cefdinir -blood Cx negative -Weaned O2 down to 2L -discharge home today, set with up home health PT and RN  COPD/respiratory failure -No active wheezing. -continue duo nebs, scheduled and PRN  Type 2 diabetes/uncontrolled -CBGs better, continue lantus   Chronic pain/multiple back surgeries -On methadone at baseline, resumed  Hypertension: -On amlodipine, benazepril, and clonidine at home. -Continue home meds  Bipolar disorder/anxiety: -Continue home Depakote, Zyprexa, and prn Alprazolam for now -with her drowsiness I am concerned for polypharmacy causing complication, pt not willing to make any changes but willing to discuss with pain management clinic and PCP.  I highly recommend to lower  the dose of some of medications.    Tobacco use: 1.5 PPD and vapingprior to onset of current symptoms. -Smoking cessation counseled  Code Status:DNR, confirmed with patient  Discharge Exam: Vitals:   03/05/18 1639 03/05/18 2322  BP: (!) 148/83 (!) 186/99  Pulse: 79 98  Resp: 19 16  Temp: 98.3 F (36.8 C) (!) 97.5 F (36.4 C)  SpO2: 91% 97%    General: AAOx3 Cardiovascular: S1S2/RRR Respiratory: improved BS, no wheezes  Discharge Instructions   Discharge Instructions    Diet - low sodium heart healthy   Complete by:  As directed    Increase activity slowly   Complete by:  As directed      Allergies as of 03/06/2018   No Known Allergies     Medication List    TAKE these medications   ALPRAZolam 1 MG tablet Commonly known as:  XANAX Take 1 mg by mouth at bedtime as needed for anxiety.   amLODipine 10 MG tablet Commonly known as:  NORVASC Take 10 mg by mouth daily.   benazepril 40 MG tablet Commonly known as:  LOTENSIN Take 40 mg by mouth daily.   cefdinir 300 MG capsule Commonly known as:  OMNICEF Take 2 capsules (600 mg total) by mouth daily for 4 days.   cloNIDine 0.1 MG tablet Commonly known as:  CATAPRES Take 1 tablet (0.1 mg total) by mouth at bedtime. What changed:  when to take this   divalproex 500 MG DR tablet Commonly known as:  DEPAKOTE Take 500 mg by mouth 2 (two) times daily.   LANTUS SOLOSTAR 100 UNIT/ML Solostar Pen Generic drug:  Insulin Glargine Inject 10 Units into the skin at bedtime.   methadone 10 MG tablet  Commonly known as:  DOLOPHINE Take 90 mg by mouth daily.   OLANZapine 10 MG tablet Commonly known as:  ZYPREXA Take 10 mg by mouth at bedtime.   ONE TOUCH ULTRA TEST test strip Generic drug:  glucose blood   VICTOZA 18 MG/3ML Sopn Generic drug:  liraglutide Inject 18 mg into the skin every morning.            Durable Medical Equipment  (From admission, onward)         Start     Ordered   03/06/18  0926  For home use only DME Walker rolling  Once    Question:  Patient needs a walker to treat with the following condition  Answer:  Weakness   03/06/18 0926   03/05/18 1706  For home use only DME oxygen  Once    Question Answer Comment  Mode or (Route) Nasal cannula   Liters per Minute 2   Frequency Continuous (stationary and portable oxygen unit needed)   Oxygen conserving device Yes   Oxygen delivery system Gas      03/05/18 1705         No Known Allergies Follow-up Information    [place], [place], PA-C. Schedule an appointment as soon as possible for a visit in 1 week(s).   Specialty:  Family Medicine Contact information: [address] [place] [address] [place] Follow up.   Specialty:  Home Health Services Why:  HHRN,HHPT Contact information: [place] [address] [place] Follow up.   Why:  oxygen, rolling walker Contact information: [address] [place] [address] [phone]            The results of significant diagnostics from this hospitalization (including imaging, microbiology, ancillary and laboratory) are listed below for reference.    Significant Diagnostic Studies: Dg Chest 2 View  Result Date: 03/02/2018 CLINICAL DATA:  Cough for a week. EXAM: CHEST - 2 VIEW COMPARISON:  December 17, 2017 FINDINGS: The heart size is normal to borderline. No cardiomegaly. No pneumothorax. Diffuse bilateral pulmonary opacities are identified new. There is a right-sided pleural effusion versus pleural thickening which is stable. No other interval changes. IMPRESSION: The diffuse bilateral pulmonary opacities are favored to represent diffuse multifocal pneumonia. Edema considered less likely. Recommend clinical correlation and short-term follow-up to ensure resolution. Electronically Signed   By: Dorise Bullion III M.D    On: 03/02/2018 12:37    Microbiology: Recent Results (from the past 240 hour(s))  Culture, blood (routine x 2) Call MD if unable to obtain prior to antibiotics being given     Status: None (Preliminary result)   Collection Time: 03/02/18  1:34 PM  Result Value Ref Range Status   Specimen Description   Final    BLOOD RIGHT FOREARM Performed at [facility], [address] [place], [place] [address]    Special Requests   Final    BOTTLES DRAWN AEROBIC AND ANAEROBIC Blood Culture results may not be optimal due to an inadequate volume of blood received in culture bottles Performed at [facility], [address] [place], [place] [address]    Culture   Final    NO GROWTH 4 DAYS Performed at [facility], [place] [address], [place], [place] [address]    Report  Status PENDING  Incomplete  Culture, blood (routine x 2) Call MD if unable to obtain prior to antibiotics being given     Status: None (Preliminary result)   Collection Time: 03/02/18  1:41 PM  Result Value Ref Range Status   Specimen Description   Final    BLOOD RIGHT HAND Performed at [facility], [address] [place], [place] [address]    Special Requests   Final    BOTTLES DRAWN AEROBIC AND ANAEROBIC Blood Culture results may not be optimal due to an inadequate volume of blood received in culture bottles Performed at [facility], [address] [place], [place] [address]    Culture   Final    NO GROWTH 4 DAYS Performed at [facility], [place] [address], [place], [place] [address]    Report Status PENDING  Incomplete     Labs: Basic Metabolic Panel: Recent Labs  Lab 03/02/18 1219 03/03/18 0320 03/04/18 0207  NA 134* 137 139  K 4.0 4.0 3.8  CL 90* 88* 91*  CO2 34* 36* 35*  GLUCOSE 441* 438* 141*  BUN 19 20 38*  CREATININE 0.64 0.80 0.93  CALCIUM 9.5 9.4 9.3   Liver Function Tests: No results for input(s): AST, ALT, ALKPHOS, BILITOT, PROT, ALBUMIN in  the last 168 hours. No results for input(s): LIPASE, AMYLASE in the last 168 hours. No results for input(s): AMMONIA in the last 168 hours. CBC: Recent Labs  Lab 03/02/18 1219 03/03/18 0320 03/04/18 0207  WBC 12.2* 9.1 13.4*  NEUTROABS 9.8*  --   --   HGB 13.6 14.5 12.7  HCT 43.0 44.4 41.5  MCV 90.5 89.2 92.2  PLT 282 314 341   Cardiac Enzymes: Recent Labs  Lab 03/02/18 1219  TROPONINI 0.06*   BNP: BNP (last 3 results) No results for input(s): BNP in the last 8760 hours.  ProBNP (last 3 results) No results for input(s): PROBNP in the last 8760 hours.  CBG: Recent Labs  Lab 03/05/18 1201 03/05/18 1306 03/05/18 1636 03/05/18 2053 03/06/18 0827  GLUCAP 140* 130* 293* 165* 300*       Signed:  Berle Mull MD.  [facility] 03/06/2018, 9:56 AM

## 2018-03-06 NOTE — Progress Notes (Addendum)
SATURATION QUALIFICATIONS: (This note is used to comply with regulatory documentation for home oxygen)  Patient Saturations on Room Air at Rest = 65%  Patient Saturations on Room Air while Ambulating = 60%  Patient Saturations on 3 Liters of oxygen while Ambulating = 93%  Please briefly explain why patient needs home oxygen: Patient de-sats on RA with rest and activity. Remains well saturated on 2L at rest, 3L with activity.   Reinaldo Berber, PT, DPT Acute Rehabilitation Services Pager: [phone] Office: [phone]

## 2018-03-07 DIAGNOSIS — J189 Pneumonia, unspecified organism: Secondary | ICD-10-CM | POA: Diagnosis not present

## 2018-03-07 DIAGNOSIS — G894 Chronic pain syndrome: Secondary | ICD-10-CM | POA: Diagnosis not present

## 2018-03-07 LAB — CULTURE, BLOOD (ROUTINE X 2)
CULTURE: NO GROWTH
Culture: NO GROWTH

## 2018-03-17 DIAGNOSIS — I10 Essential (primary) hypertension: Secondary | ICD-10-CM | POA: Diagnosis not present

## 2018-03-17 DIAGNOSIS — Z794 Long term (current) use of insulin: Secondary | ICD-10-CM | POA: Diagnosis not present

## 2018-03-17 DIAGNOSIS — Z79891 Long term (current) use of opiate analgesic: Secondary | ICD-10-CM | POA: Diagnosis not present

## 2018-03-17 DIAGNOSIS — Z853 Personal history of malignant neoplasm of breast: Secondary | ICD-10-CM | POA: Diagnosis not present

## 2018-03-17 DIAGNOSIS — Z9981 Dependence on supplemental oxygen: Secondary | ICD-10-CM | POA: Diagnosis not present

## 2018-03-17 DIAGNOSIS — E119 Type 2 diabetes mellitus without complications: Secondary | ICD-10-CM | POA: Diagnosis not present

## 2018-03-17 DIAGNOSIS — J449 Chronic obstructive pulmonary disease, unspecified: Secondary | ICD-10-CM | POA: Diagnosis not present

## 2018-03-17 DIAGNOSIS — G8929 Other chronic pain: Secondary | ICD-10-CM | POA: Diagnosis not present

## 2018-03-18 DIAGNOSIS — J449 Chronic obstructive pulmonary disease, unspecified: Secondary | ICD-10-CM | POA: Diagnosis not present

## 2018-03-18 DIAGNOSIS — Z853 Personal history of malignant neoplasm of breast: Secondary | ICD-10-CM | POA: Diagnosis not present

## 2018-03-18 DIAGNOSIS — I10 Essential (primary) hypertension: Secondary | ICD-10-CM | POA: Diagnosis not present

## 2018-03-18 DIAGNOSIS — G8929 Other chronic pain: Secondary | ICD-10-CM | POA: Diagnosis not present

## 2018-03-18 DIAGNOSIS — Z79891 Long term (current) use of opiate analgesic: Secondary | ICD-10-CM | POA: Diagnosis not present

## 2018-03-18 DIAGNOSIS — Z9981 Dependence on supplemental oxygen: Secondary | ICD-10-CM | POA: Diagnosis not present

## 2018-03-18 DIAGNOSIS — Z794 Long term (current) use of insulin: Secondary | ICD-10-CM | POA: Diagnosis not present

## 2018-03-18 DIAGNOSIS — E119 Type 2 diabetes mellitus without complications: Secondary | ICD-10-CM | POA: Diagnosis not present

## 2018-04-06 DIAGNOSIS — J441 Chronic obstructive pulmonary disease with (acute) exacerbation: Secondary | ICD-10-CM | POA: Diagnosis not present

## 2018-05-07 DIAGNOSIS — J441 Chronic obstructive pulmonary disease with (acute) exacerbation: Secondary | ICD-10-CM | POA: Diagnosis not present

## 2018-06-05 DIAGNOSIS — J441 Chronic obstructive pulmonary disease with (acute) exacerbation: Secondary | ICD-10-CM | POA: Diagnosis not present

## 2018-06-05 DIAGNOSIS — E119 Type 2 diabetes mellitus without complications: Secondary | ICD-10-CM | POA: Diagnosis not present

## 2018-07-06 DIAGNOSIS — E119 Type 2 diabetes mellitus without complications: Secondary | ICD-10-CM | POA: Diagnosis not present

## 2018-07-06 DIAGNOSIS — J441 Chronic obstructive pulmonary disease with (acute) exacerbation: Secondary | ICD-10-CM | POA: Diagnosis not present

## 2018-07-26 DIAGNOSIS — Z9119 Patient's noncompliance with other medical treatment and regimen: Secondary | ICD-10-CM | POA: Diagnosis not present

## 2018-07-26 DIAGNOSIS — L03115 Cellulitis of right lower limb: Secondary | ICD-10-CM | POA: Diagnosis not present

## 2018-07-26 DIAGNOSIS — Z7984 Long term (current) use of oral hypoglycemic drugs: Secondary | ICD-10-CM | POA: Diagnosis not present

## 2018-07-26 DIAGNOSIS — E1129 Type 2 diabetes mellitus with other diabetic kidney complication: Secondary | ICD-10-CM | POA: Diagnosis not present

## 2018-08-05 DIAGNOSIS — E119 Type 2 diabetes mellitus without complications: Secondary | ICD-10-CM | POA: Diagnosis not present

## 2018-08-05 DIAGNOSIS — J441 Chronic obstructive pulmonary disease with (acute) exacerbation: Secondary | ICD-10-CM | POA: Diagnosis not present

## 2018-09-05 DIAGNOSIS — E119 Type 2 diabetes mellitus without complications: Secondary | ICD-10-CM | POA: Diagnosis not present

## 2018-09-05 DIAGNOSIS — J441 Chronic obstructive pulmonary disease with (acute) exacerbation: Secondary | ICD-10-CM | POA: Diagnosis not present

## 2018-10-04 DIAGNOSIS — I8392 Asymptomatic varicose veins of left lower extremity: Secondary | ICD-10-CM | POA: Diagnosis not present

## 2018-10-04 DIAGNOSIS — Z794 Long term (current) use of insulin: Secondary | ICD-10-CM | POA: Diagnosis not present

## 2018-10-04 DIAGNOSIS — E1129 Type 2 diabetes mellitus with other diabetic kidney complication: Secondary | ICD-10-CM | POA: Diagnosis not present

## 2018-10-04 DIAGNOSIS — I10 Essential (primary) hypertension: Secondary | ICD-10-CM | POA: Diagnosis not present

## 2018-10-04 DIAGNOSIS — M79672 Pain in left foot: Secondary | ICD-10-CM | POA: Diagnosis not present

## 2018-10-04 DIAGNOSIS — R809 Proteinuria, unspecified: Secondary | ICD-10-CM | POA: Diagnosis not present

## 2018-10-04 DIAGNOSIS — H9190 Unspecified hearing loss, unspecified ear: Secondary | ICD-10-CM | POA: Diagnosis not present

## 2018-10-04 DIAGNOSIS — E1142 Type 2 diabetes mellitus with diabetic polyneuropathy: Secondary | ICD-10-CM | POA: Diagnosis not present

## 2018-10-05 DIAGNOSIS — J441 Chronic obstructive pulmonary disease with (acute) exacerbation: Secondary | ICD-10-CM | POA: Diagnosis not present

## 2018-10-05 DIAGNOSIS — E119 Type 2 diabetes mellitus without complications: Secondary | ICD-10-CM | POA: Diagnosis not present

## 2018-10-21 DIAGNOSIS — H903 Sensorineural hearing loss, bilateral: Secondary | ICD-10-CM | POA: Diagnosis not present

## 2018-11-05 DIAGNOSIS — J441 Chronic obstructive pulmonary disease with (acute) exacerbation: Secondary | ICD-10-CM | POA: Diagnosis not present

## 2018-11-05 DIAGNOSIS — E119 Type 2 diabetes mellitus without complications: Secondary | ICD-10-CM | POA: Diagnosis not present

## 2018-11-07 DIAGNOSIS — M79672 Pain in left foot: Secondary | ICD-10-CM | POA: Diagnosis not present

## 2018-11-07 DIAGNOSIS — E1142 Type 2 diabetes mellitus with diabetic polyneuropathy: Secondary | ICD-10-CM | POA: Diagnosis not present

## 2018-11-07 DIAGNOSIS — M10072 Idiopathic gout, left ankle and foot: Secondary | ICD-10-CM | POA: Diagnosis not present

## 2018-12-06 DIAGNOSIS — J441 Chronic obstructive pulmonary disease with (acute) exacerbation: Secondary | ICD-10-CM | POA: Diagnosis not present

## 2018-12-06 DIAGNOSIS — E119 Type 2 diabetes mellitus without complications: Secondary | ICD-10-CM | POA: Diagnosis not present

## 2018-12-10 DIAGNOSIS — M109 Gout, unspecified: Secondary | ICD-10-CM | POA: Diagnosis not present

## 2018-12-10 DIAGNOSIS — M7989 Other specified soft tissue disorders: Secondary | ICD-10-CM | POA: Diagnosis not present

## 2018-12-10 DIAGNOSIS — M79675 Pain in left toe(s): Secondary | ICD-10-CM | POA: Diagnosis not present

## 2018-12-18 DIAGNOSIS — Z794 Long term (current) use of insulin: Secondary | ICD-10-CM | POA: Diagnosis not present

## 2018-12-18 DIAGNOSIS — Z713 Dietary counseling and surveillance: Secondary | ICD-10-CM | POA: Diagnosis not present

## 2018-12-18 DIAGNOSIS — E1142 Type 2 diabetes mellitus with diabetic polyneuropathy: Secondary | ICD-10-CM | POA: Diagnosis not present

## 2018-12-18 DIAGNOSIS — E1129 Type 2 diabetes mellitus with other diabetic kidney complication: Secondary | ICD-10-CM | POA: Diagnosis not present

## 2019-01-05 DIAGNOSIS — E119 Type 2 diabetes mellitus without complications: Secondary | ICD-10-CM | POA: Diagnosis not present

## 2019-01-05 DIAGNOSIS — J441 Chronic obstructive pulmonary disease with (acute) exacerbation: Secondary | ICD-10-CM | POA: Diagnosis not present

## 2019-01-27 ENCOUNTER — Other Ambulatory Visit: Payer: Self-pay

## 2019-01-27 DIAGNOSIS — M19071 Primary osteoarthritis, right ankle and foot: Secondary | ICD-10-CM | POA: Diagnosis not present

## 2019-01-27 DIAGNOSIS — M19072 Primary osteoarthritis, left ankle and foot: Secondary | ICD-10-CM | POA: Diagnosis not present

## 2019-01-27 DIAGNOSIS — M109 Gout, unspecified: Secondary | ICD-10-CM | POA: Diagnosis not present

## 2019-01-27 DIAGNOSIS — E1165 Type 2 diabetes mellitus with hyperglycemia: Secondary | ICD-10-CM | POA: Diagnosis not present

## 2019-01-27 NOTE — Patient Outreach (Signed)
[facility]) Care Management  01/27/2019  Sandra Wade December 03, 1950 [id number]   Medication Adherence call to Mrs. Sandra Wade patient did answer patient is past due on Benazepril 40 mg under [place].   [place] Management Direct Dial [phone]  Fax [phone] [email or web address]

## 2019-02-05 DIAGNOSIS — J441 Chronic obstructive pulmonary disease with (acute) exacerbation: Secondary | ICD-10-CM | POA: Diagnosis not present

## 2019-02-05 DIAGNOSIS — E119 Type 2 diabetes mellitus without complications: Secondary | ICD-10-CM | POA: Diagnosis not present

## 2019-02-20 IMAGING — DX DG CHEST 2V
2 series · 2 of 2 positions shown · non-contrast
Comparison: December 17, 2017

CLINICAL DATA: Cough for a week.

EXAM:
CHEST - 2 VIEW

[chest pa]
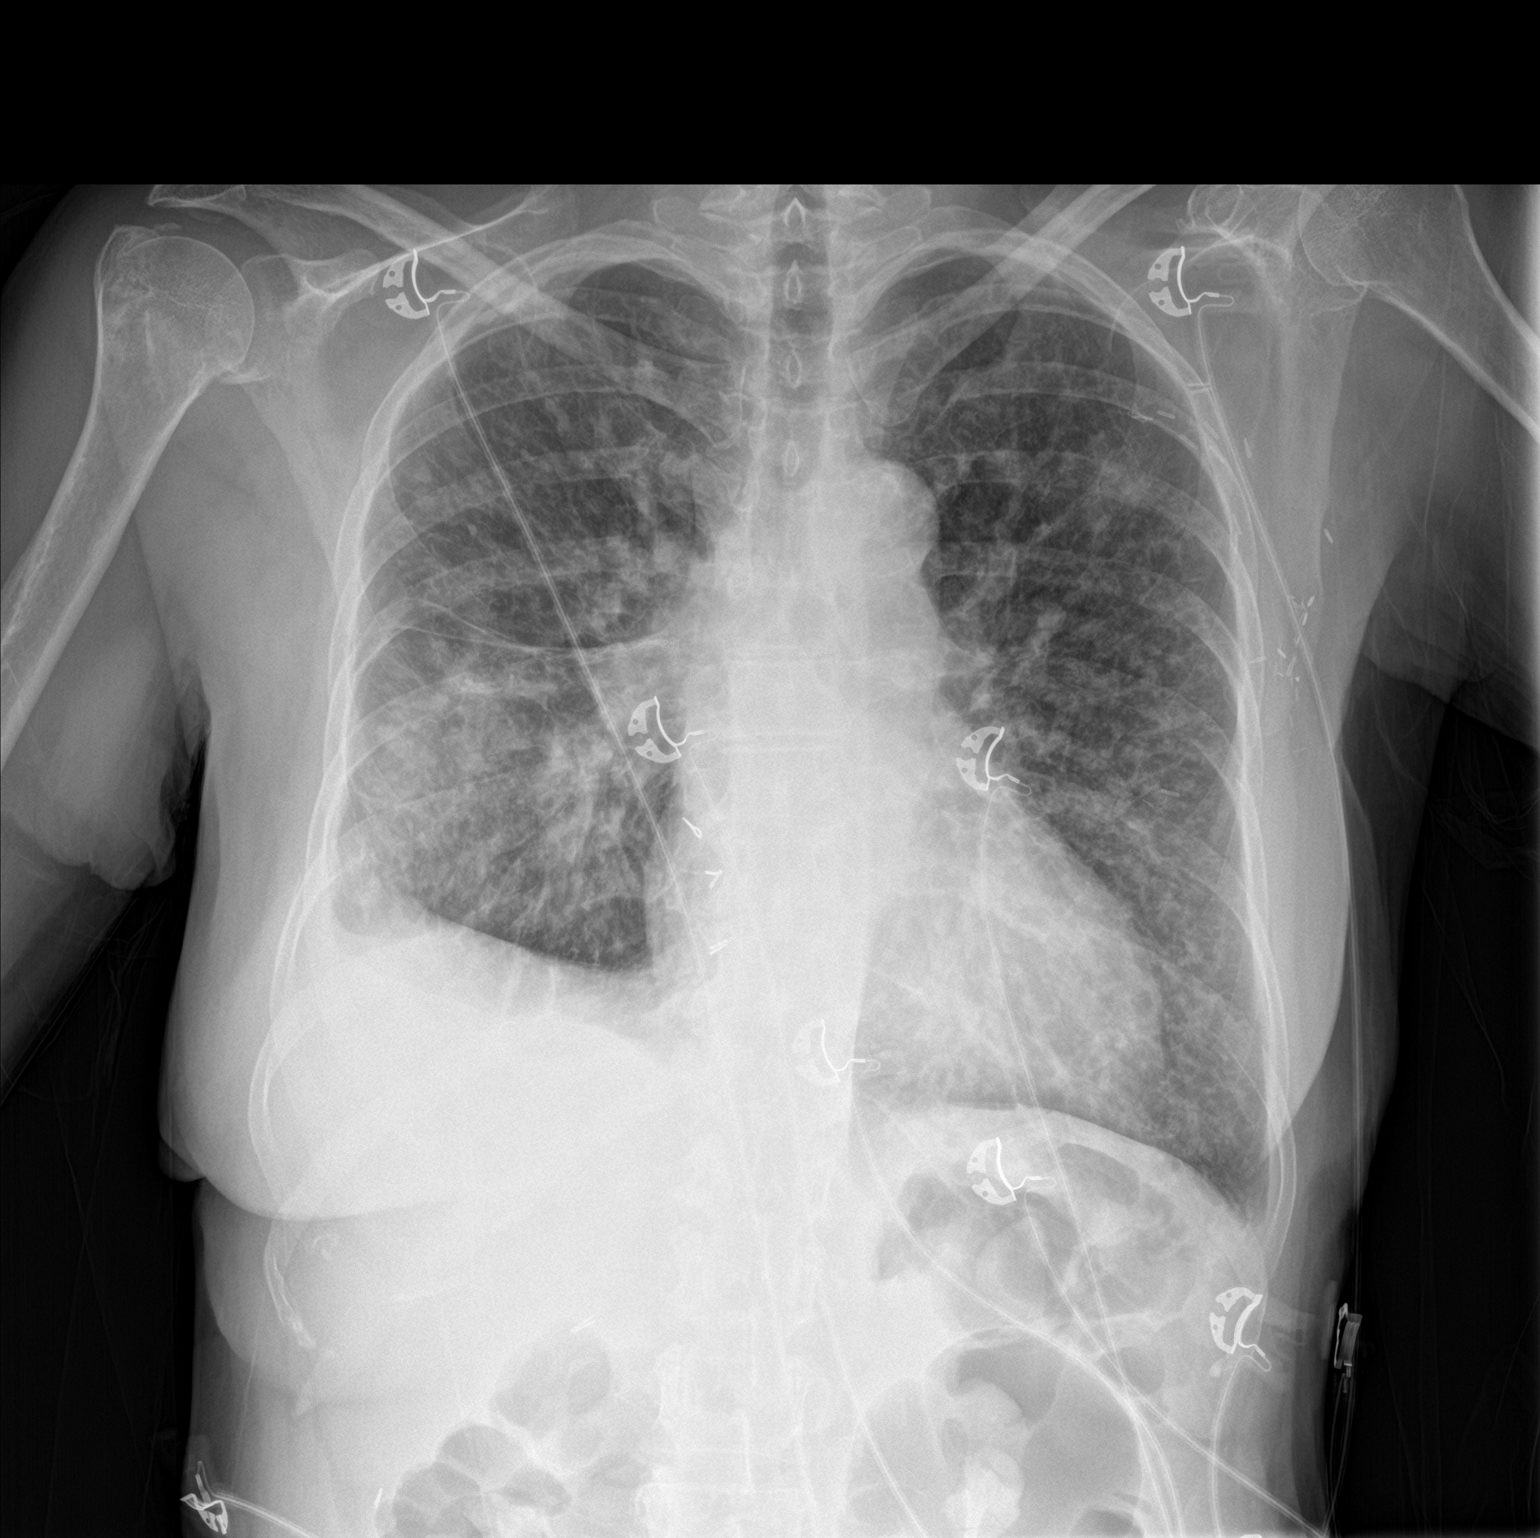

[chest lat]
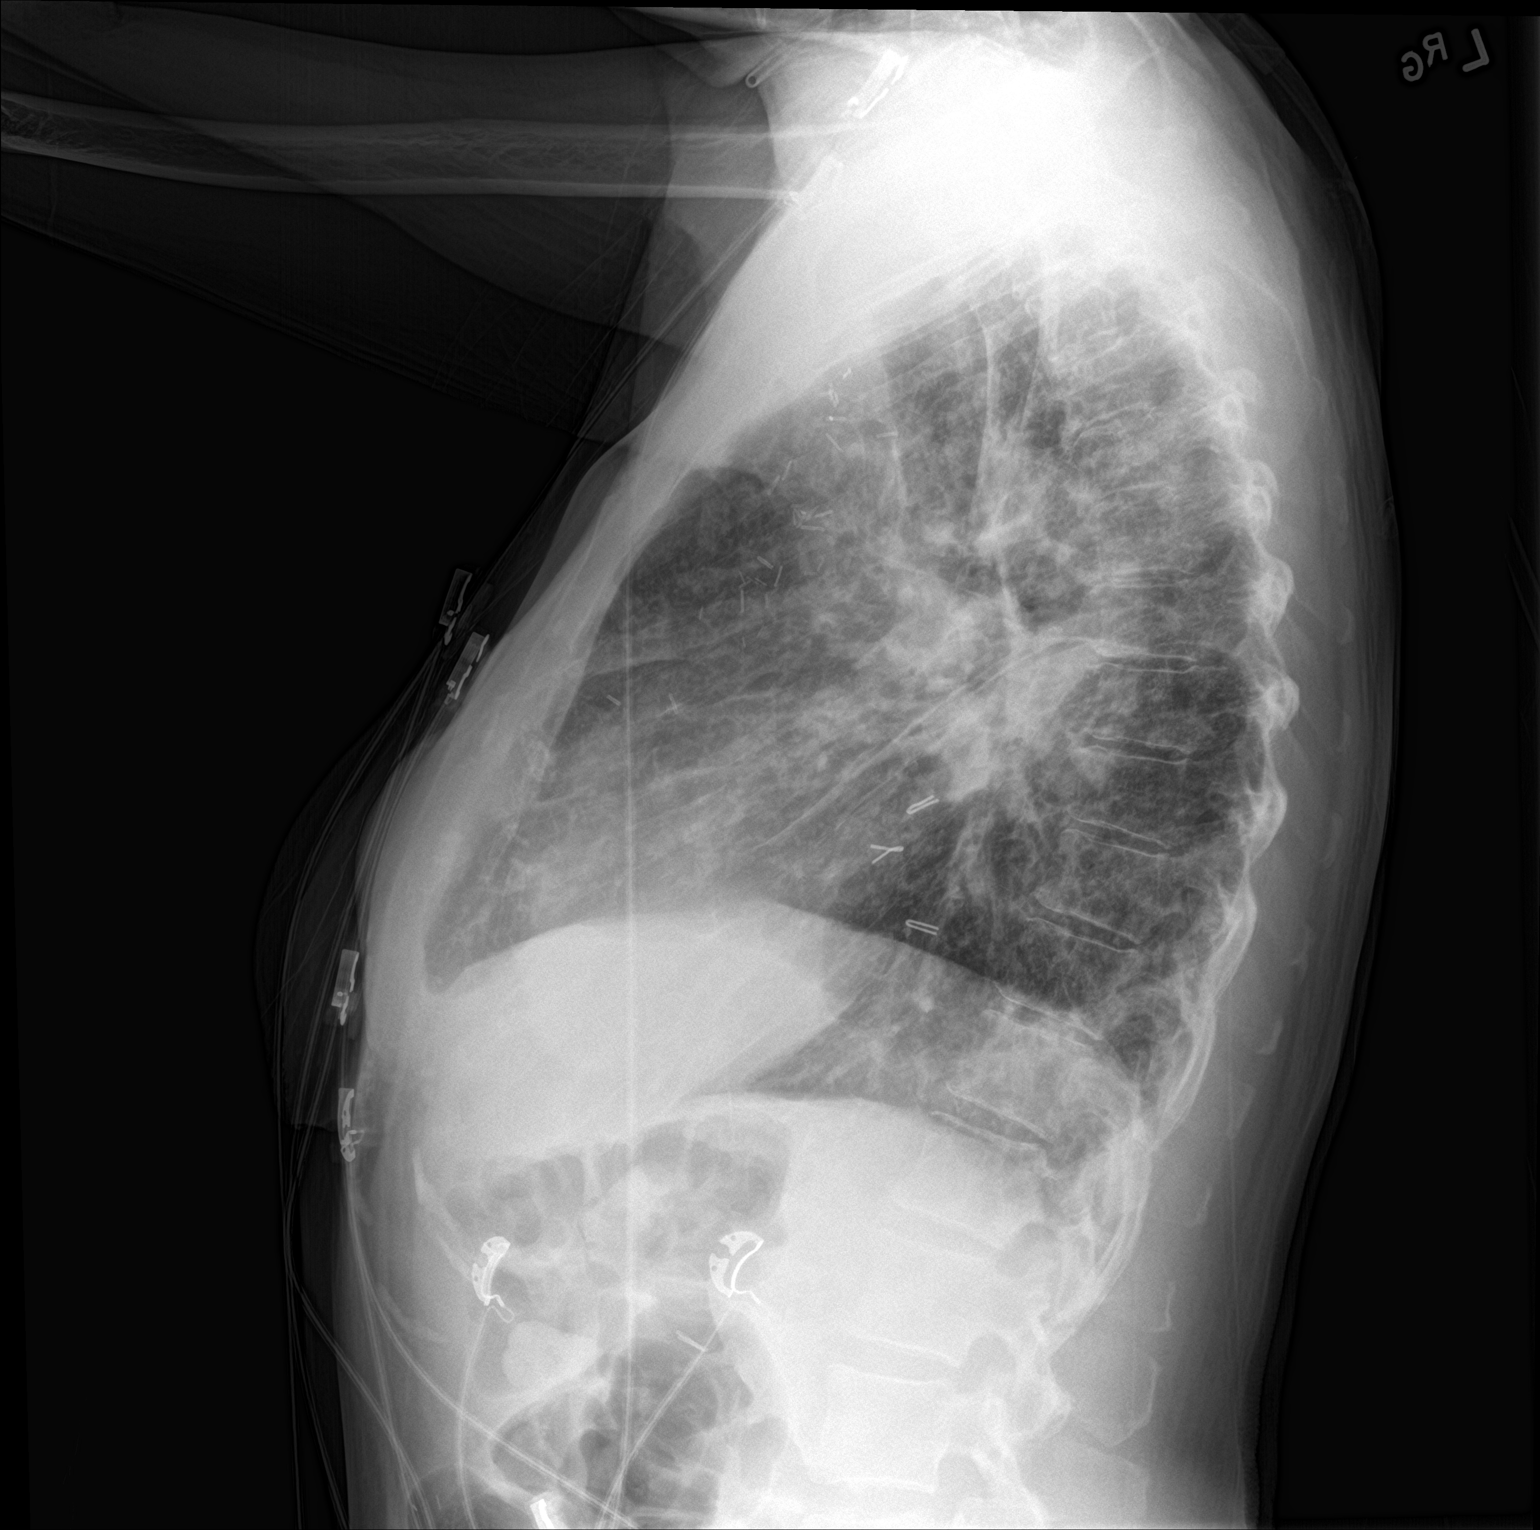

[2 of 2 positions shown; findings below may reference images not displayed]

FINDINGS: The heart size is normal to borderline. No cardiomegaly. No
pneumothorax. Diffuse bilateral pulmonary opacities are identified
new. There is a right-sided pleural effusion versus pleural
thickening which is stable. No other interval changes.
IMPRESSION: The diffuse bilateral pulmonary opacities are favored to represent
diffuse multifocal pneumonia. Edema considered less likely.
Recommend clinical correlation and short-term follow-up to ensure
resolution.

## 2019-02-21 ENCOUNTER — Other Ambulatory Visit: Payer: Self-pay | Admitting: Family Medicine

## 2019-02-21 DIAGNOSIS — Z1231 Encounter for screening mammogram for malignant neoplasm of breast: Secondary | ICD-10-CM

## 2019-03-07 DIAGNOSIS — J441 Chronic obstructive pulmonary disease with (acute) exacerbation: Secondary | ICD-10-CM | POA: Diagnosis not present

## 2019-03-07 DIAGNOSIS — E119 Type 2 diabetes mellitus without complications: Secondary | ICD-10-CM | POA: Diagnosis not present

## 2019-04-07 DIAGNOSIS — E119 Type 2 diabetes mellitus without complications: Secondary | ICD-10-CM | POA: Diagnosis not present

## 2019-04-07 DIAGNOSIS — J441 Chronic obstructive pulmonary disease with (acute) exacerbation: Secondary | ICD-10-CM | POA: Diagnosis not present

## 2019-05-08 DIAGNOSIS — J441 Chronic obstructive pulmonary disease with (acute) exacerbation: Secondary | ICD-10-CM | POA: Diagnosis not present

## 2019-05-08 DIAGNOSIS — E119 Type 2 diabetes mellitus without complications: Secondary | ICD-10-CM | POA: Diagnosis not present

## 2019-05-12 DIAGNOSIS — I10 Essential (primary) hypertension: Secondary | ICD-10-CM | POA: Diagnosis not present

## 2019-05-12 DIAGNOSIS — M109 Gout, unspecified: Secondary | ICD-10-CM | POA: Diagnosis not present

## 2019-05-12 DIAGNOSIS — Z794 Long term (current) use of insulin: Secondary | ICD-10-CM | POA: Diagnosis not present

## 2019-05-12 DIAGNOSIS — E1129 Type 2 diabetes mellitus with other diabetic kidney complication: Secondary | ICD-10-CM | POA: Diagnosis not present

## 2019-05-12 DIAGNOSIS — E1165 Type 2 diabetes mellitus with hyperglycemia: Secondary | ICD-10-CM | POA: Diagnosis not present

## 2019-05-12 DIAGNOSIS — E1142 Type 2 diabetes mellitus with diabetic polyneuropathy: Secondary | ICD-10-CM | POA: Diagnosis not present

## 2019-06-14 DIAGNOSIS — Z23 Encounter for immunization: Secondary | ICD-10-CM | POA: Diagnosis not present

## 2019-06-19 DIAGNOSIS — E119 Type 2 diabetes mellitus without complications: Secondary | ICD-10-CM | POA: Diagnosis not present

## 2019-06-19 DIAGNOSIS — J441 Chronic obstructive pulmonary disease with (acute) exacerbation: Secondary | ICD-10-CM | POA: Diagnosis not present

## 2019-07-06 IMAGING — US US EXTREM  UP VENOUS*L*
1 series · 13 of 24 positions shown · non-contrast
Comparison: None.

CLINICAL DATA: Left upper extremity edema.



[Series 1: us extrem up venous*left* · 13 of 34 slices shown]
[im 1/34]
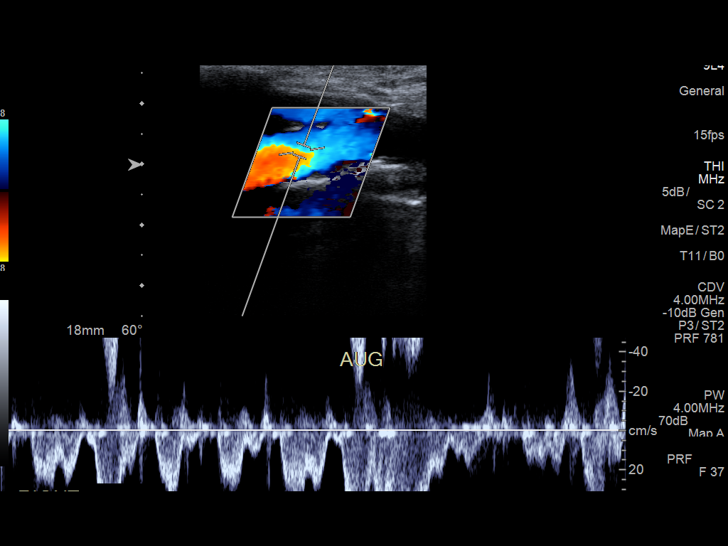
[im 3/34]
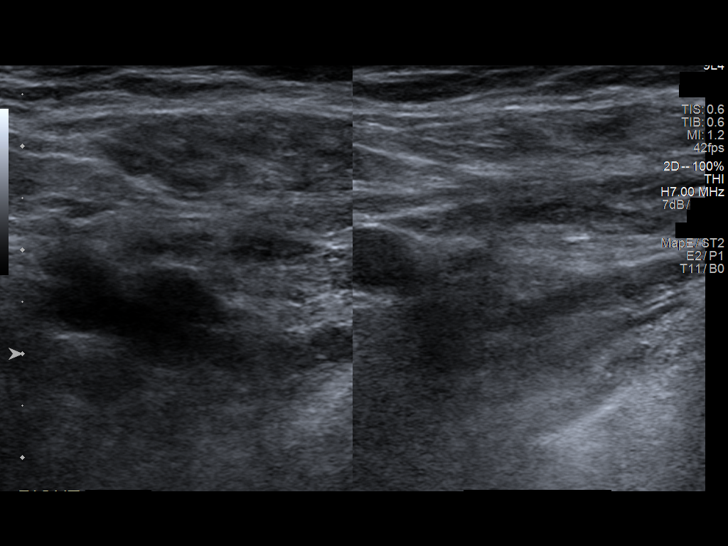
[im 6/34]
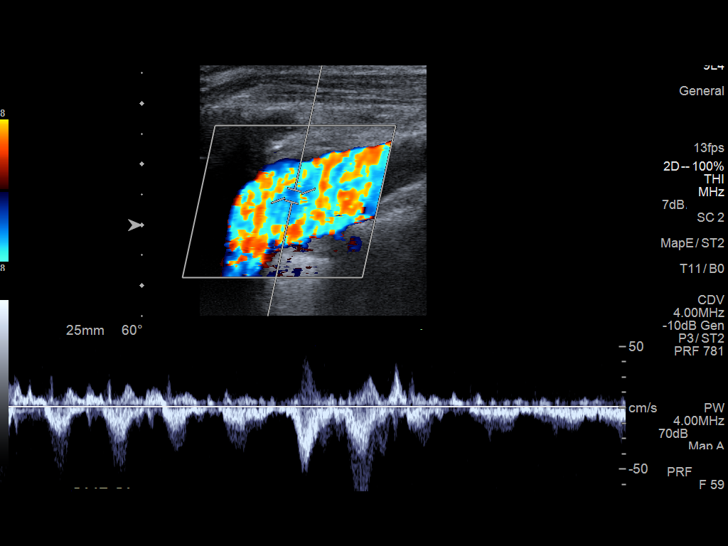
[im 9/34]
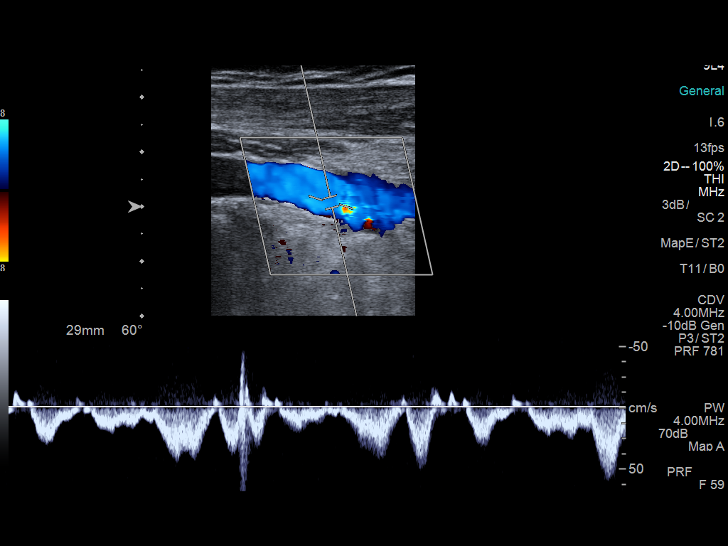
[im 12/34]
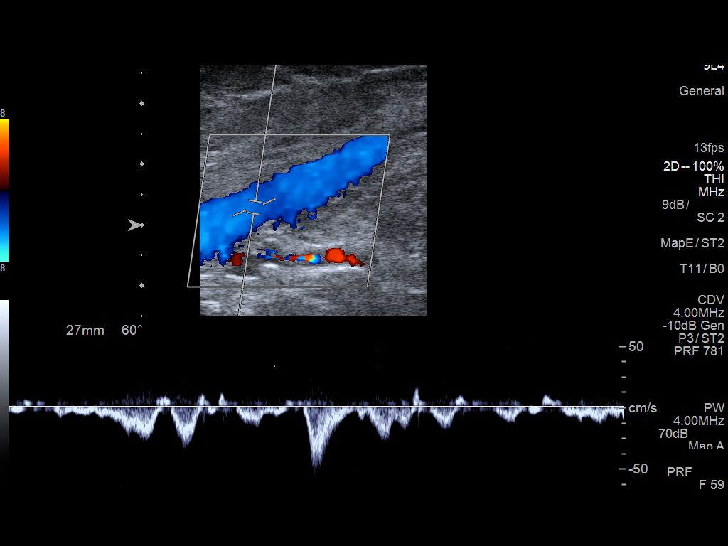
[im 15/34]
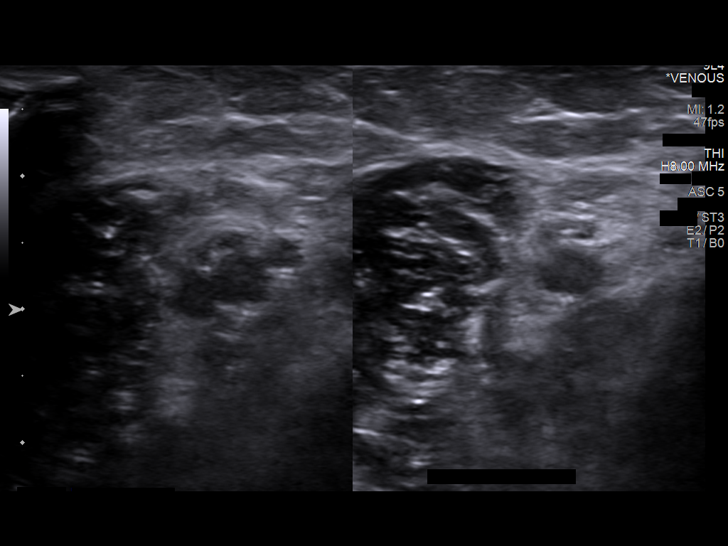
[im 18/34]
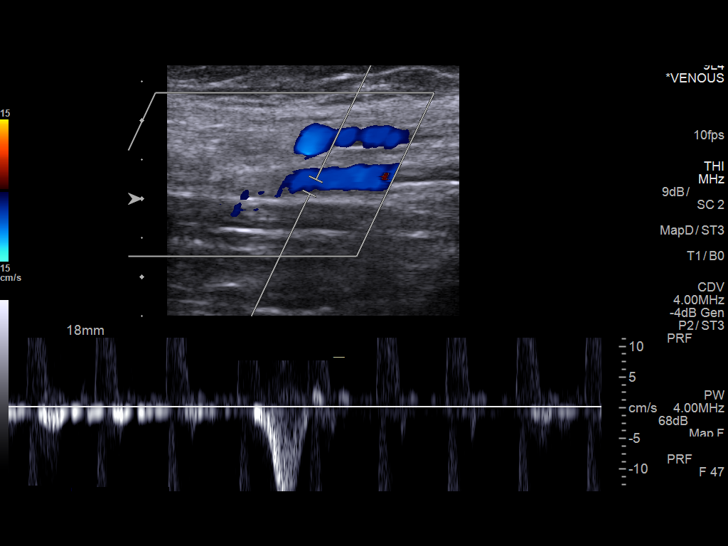
[im 19/34]
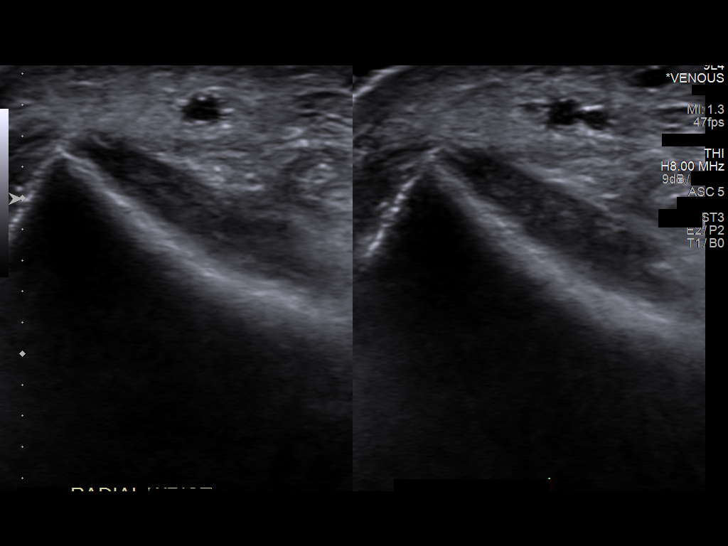
[im 22/34]
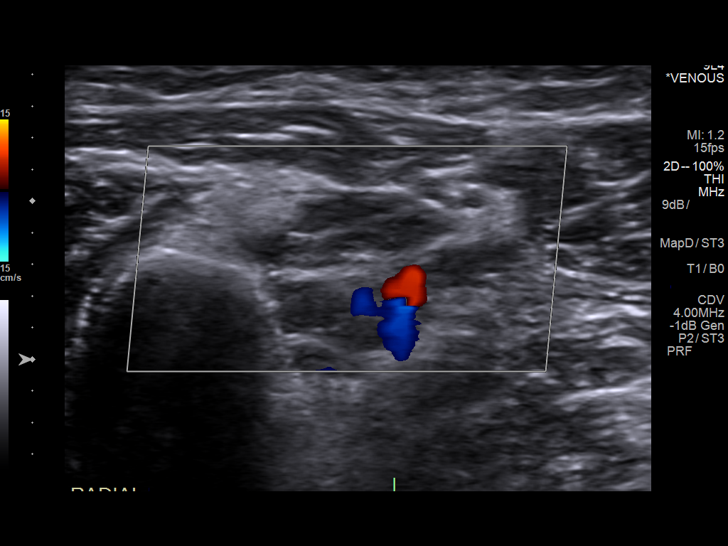
[im 25/34]
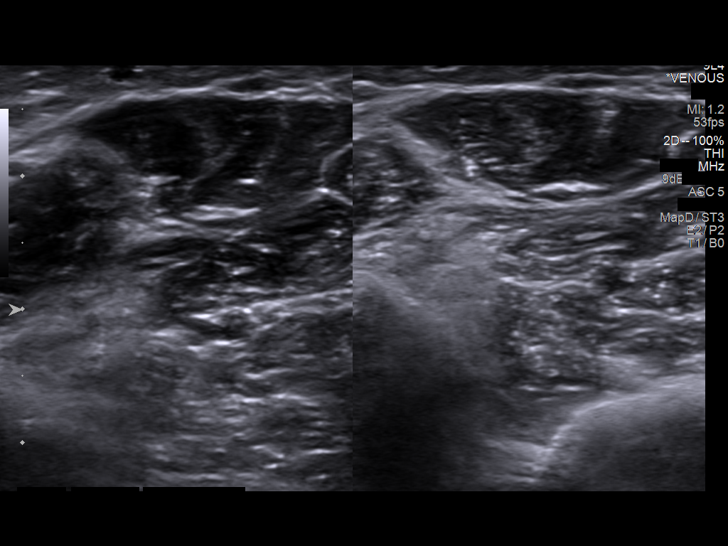
[im 28/34]
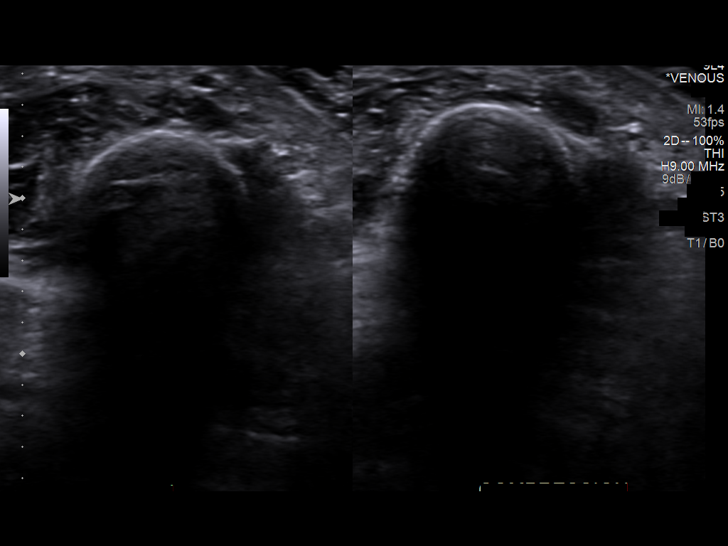
[im 31/34]
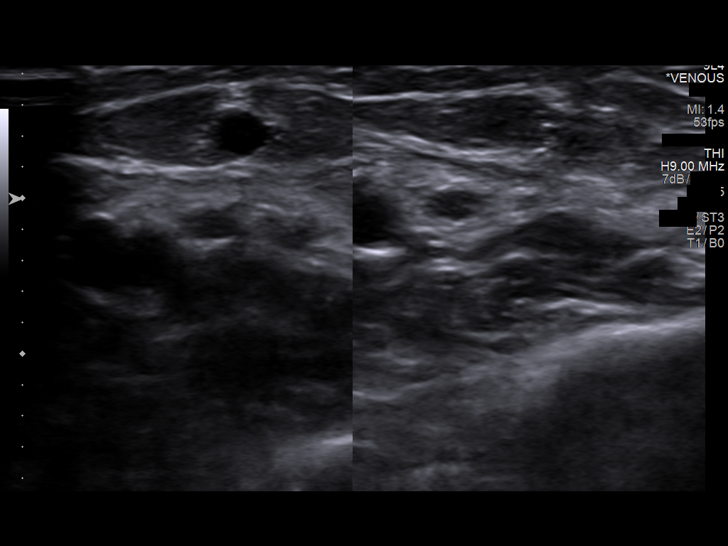
[im 34/34]
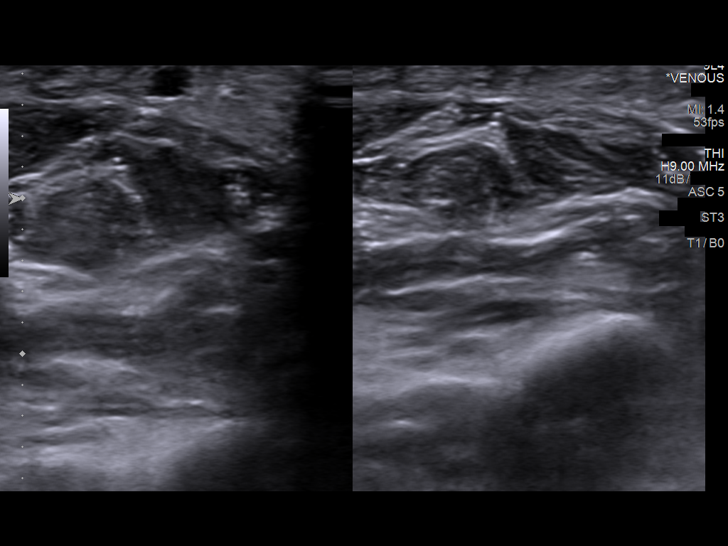

[13 of 24 positions shown; findings below may reference images not displayed]

FINDINGS: Contralateral Subclavian Vein: Respiratory phasicity is normal and
symmetric with the symptomatic side. No evidence of thrombus. Normal
compressibility.

Internal Jugular Vein: No evidence of thrombus. Normal
compressibility, respiratory phasicity and response to augmentation.

Subclavian Vein: No evidence of thrombus. Normal compressibility,
respiratory phasicity and response to augmentation.

Axillary Vein: No evidence of thrombus. Normal compressibility,
respiratory phasicity and response to augmentation.

Cephalic Vein: No evidence of thrombus. Normal compressibility,
respiratory phasicity and response to augmentation.

Basilic Vein: No evidence of thrombus. Normal compressibility,
respiratory phasicity and response to augmentation.

Brachial Veins: No evidence of thrombus. Normal compressibility,
respiratory phasicity and response to augmentation.

Radial Veins: No evidence of thrombus. Normal compressibility,
respiratory phasicity and response to augmentation.

Ulnar Veins: No evidence of thrombus. Normal compressibility,
respiratory phasicity and response to augmentation.

Venous Reflux:  None visualized.

Other Findings: No evidence of superficial thrombophlebitis or
abnormal fluid collection.
IMPRESSION: No evidence of DVT within the left upper extremity.

## 2019-07-14 DIAGNOSIS — R202 Paresthesia of skin: Secondary | ICD-10-CM | POA: Diagnosis not present

## 2019-07-14 DIAGNOSIS — M109 Gout, unspecified: Secondary | ICD-10-CM | POA: Diagnosis not present

## 2019-07-14 DIAGNOSIS — G5632 Lesion of radial nerve, left upper limb: Secondary | ICD-10-CM | POA: Diagnosis not present

## 2019-08-01 DIAGNOSIS — E1165 Type 2 diabetes mellitus with hyperglycemia: Secondary | ICD-10-CM | POA: Diagnosis not present

## 2019-08-01 DIAGNOSIS — E1129 Type 2 diabetes mellitus with other diabetic kidney complication: Secondary | ICD-10-CM | POA: Diagnosis not present

## 2019-08-01 DIAGNOSIS — E1142 Type 2 diabetes mellitus with diabetic polyneuropathy: Secondary | ICD-10-CM | POA: Diagnosis not present

## 2019-08-01 DIAGNOSIS — R809 Proteinuria, unspecified: Secondary | ICD-10-CM | POA: Diagnosis not present

## 2019-08-01 DIAGNOSIS — Z794 Long term (current) use of insulin: Secondary | ICD-10-CM | POA: Diagnosis not present

## 2019-08-05 DIAGNOSIS — J441 Chronic obstructive pulmonary disease with (acute) exacerbation: Secondary | ICD-10-CM | POA: Diagnosis not present

## 2019-08-05 DIAGNOSIS — E119 Type 2 diabetes mellitus without complications: Secondary | ICD-10-CM | POA: Diagnosis not present

## 2019-09-05 DIAGNOSIS — J441 Chronic obstructive pulmonary disease with (acute) exacerbation: Secondary | ICD-10-CM | POA: Diagnosis not present

## 2019-09-05 DIAGNOSIS — E119 Type 2 diabetes mellitus without complications: Secondary | ICD-10-CM | POA: Diagnosis not present

## 2019-10-05 DIAGNOSIS — J441 Chronic obstructive pulmonary disease with (acute) exacerbation: Secondary | ICD-10-CM | POA: Diagnosis not present

## 2019-10-05 DIAGNOSIS — E119 Type 2 diabetes mellitus without complications: Secondary | ICD-10-CM | POA: Diagnosis not present

## 2019-10-15 DIAGNOSIS — E1165 Type 2 diabetes mellitus with hyperglycemia: Secondary | ICD-10-CM | POA: Diagnosis not present

## 2019-10-15 DIAGNOSIS — Z794 Long term (current) use of insulin: Secondary | ICD-10-CM | POA: Diagnosis not present

## 2019-10-15 DIAGNOSIS — M10072 Idiopathic gout, left ankle and foot: Secondary | ICD-10-CM | POA: Diagnosis not present

## 2019-10-15 DIAGNOSIS — I10 Essential (primary) hypertension: Secondary | ICD-10-CM | POA: Diagnosis not present

## 2019-10-15 DIAGNOSIS — K5909 Other constipation: Secondary | ICD-10-CM | POA: Diagnosis not present

## 2019-11-05 DIAGNOSIS — E119 Type 2 diabetes mellitus without complications: Secondary | ICD-10-CM | POA: Diagnosis not present

## 2019-11-05 DIAGNOSIS — J441 Chronic obstructive pulmonary disease with (acute) exacerbation: Secondary | ICD-10-CM | POA: Diagnosis not present

## 2019-11-07 DIAGNOSIS — E1165 Type 2 diabetes mellitus with hyperglycemia: Secondary | ICD-10-CM | POA: Diagnosis not present

## 2019-11-07 DIAGNOSIS — E1129 Type 2 diabetes mellitus with other diabetic kidney complication: Secondary | ICD-10-CM | POA: Diagnosis not present

## 2019-11-07 DIAGNOSIS — R809 Proteinuria, unspecified: Secondary | ICD-10-CM | POA: Diagnosis not present

## 2019-11-07 DIAGNOSIS — Z794 Long term (current) use of insulin: Secondary | ICD-10-CM | POA: Diagnosis not present

## 2019-11-07 DIAGNOSIS — E1142 Type 2 diabetes mellitus with diabetic polyneuropathy: Secondary | ICD-10-CM | POA: Diagnosis not present

## 2019-12-01 DIAGNOSIS — Z794 Long term (current) use of insulin: Secondary | ICD-10-CM | POA: Diagnosis not present

## 2019-12-01 DIAGNOSIS — E119 Type 2 diabetes mellitus without complications: Secondary | ICD-10-CM | POA: Diagnosis not present

## 2019-12-06 DIAGNOSIS — J441 Chronic obstructive pulmonary disease with (acute) exacerbation: Secondary | ICD-10-CM | POA: Diagnosis not present

## 2019-12-06 DIAGNOSIS — E119 Type 2 diabetes mellitus without complications: Secondary | ICD-10-CM | POA: Diagnosis not present

## 2020-01-05 DIAGNOSIS — E119 Type 2 diabetes mellitus without complications: Secondary | ICD-10-CM | POA: Diagnosis not present

## 2020-01-05 DIAGNOSIS — J441 Chronic obstructive pulmonary disease with (acute) exacerbation: Secondary | ICD-10-CM | POA: Diagnosis not present

## 2020-01-15 DIAGNOSIS — J069 Acute upper respiratory infection, unspecified: Secondary | ICD-10-CM | POA: Diagnosis not present

## 2020-01-15 DIAGNOSIS — R058 Other specified cough: Secondary | ICD-10-CM | POA: Diagnosis not present

## 2020-01-15 DIAGNOSIS — R059 Cough, unspecified: Secondary | ICD-10-CM | POA: Diagnosis not present

## 2020-01-15 DIAGNOSIS — J90 Pleural effusion, not elsewhere classified: Secondary | ICD-10-CM | POA: Diagnosis not present

## 2020-01-15 DIAGNOSIS — R918 Other nonspecific abnormal finding of lung field: Secondary | ICD-10-CM | POA: Diagnosis not present

## 2020-01-15 DIAGNOSIS — E1142 Type 2 diabetes mellitus with diabetic polyneuropathy: Secondary | ICD-10-CM | POA: Diagnosis not present

## 2020-01-15 DIAGNOSIS — R2681 Unsteadiness on feet: Secondary | ICD-10-CM | POA: Diagnosis not present

## 2020-01-28 DIAGNOSIS — Z79899 Other long term (current) drug therapy: Secondary | ICD-10-CM | POA: Diagnosis not present

## 2020-01-29 DIAGNOSIS — R809 Proteinuria, unspecified: Secondary | ICD-10-CM | POA: Diagnosis not present

## 2020-01-29 DIAGNOSIS — I10 Essential (primary) hypertension: Secondary | ICD-10-CM | POA: Diagnosis not present

## 2020-01-29 DIAGNOSIS — E1129 Type 2 diabetes mellitus with other diabetic kidney complication: Secondary | ICD-10-CM | POA: Diagnosis not present

## 2020-02-05 DIAGNOSIS — J441 Chronic obstructive pulmonary disease with (acute) exacerbation: Secondary | ICD-10-CM | POA: Diagnosis not present

## 2020-02-05 DIAGNOSIS — E119 Type 2 diabetes mellitus without complications: Secondary | ICD-10-CM | POA: Diagnosis not present

## 2020-02-06 DIAGNOSIS — E1165 Type 2 diabetes mellitus with hyperglycemia: Secondary | ICD-10-CM | POA: Diagnosis not present

## 2020-02-06 DIAGNOSIS — E1129 Type 2 diabetes mellitus with other diabetic kidney complication: Secondary | ICD-10-CM | POA: Diagnosis not present

## 2020-02-06 DIAGNOSIS — K1370 Unspecified lesions of oral mucosa: Secondary | ICD-10-CM | POA: Diagnosis not present

## 2020-02-06 DIAGNOSIS — R809 Proteinuria, unspecified: Secondary | ICD-10-CM | POA: Diagnosis not present

## 2020-02-06 DIAGNOSIS — Z794 Long term (current) use of insulin: Secondary | ICD-10-CM | POA: Diagnosis not present

## 2020-03-06 DIAGNOSIS — E119 Type 2 diabetes mellitus without complications: Secondary | ICD-10-CM | POA: Diagnosis not present

## 2020-03-06 DIAGNOSIS — J441 Chronic obstructive pulmonary disease with (acute) exacerbation: Secondary | ICD-10-CM | POA: Diagnosis not present

## 2020-04-06 DIAGNOSIS — J441 Chronic obstructive pulmonary disease with (acute) exacerbation: Secondary | ICD-10-CM | POA: Diagnosis not present

## 2020-04-06 DIAGNOSIS — E119 Type 2 diabetes mellitus without complications: Secondary | ICD-10-CM | POA: Diagnosis not present

## 2020-05-07 DIAGNOSIS — J441 Chronic obstructive pulmonary disease with (acute) exacerbation: Secondary | ICD-10-CM | POA: Diagnosis not present

## 2020-05-07 DIAGNOSIS — E119 Type 2 diabetes mellitus without complications: Secondary | ICD-10-CM | POA: Diagnosis not present

## 2020-05-08 DIAGNOSIS — I159 Secondary hypertension, unspecified: Secondary | ICD-10-CM | POA: Diagnosis not present

## 2020-05-08 DIAGNOSIS — K5900 Constipation, unspecified: Secondary | ICD-10-CM | POA: Diagnosis not present

## 2020-05-08 DIAGNOSIS — R1111 Vomiting without nausea: Secondary | ICD-10-CM | POA: Diagnosis not present

## 2020-05-08 DIAGNOSIS — R1084 Generalized abdominal pain: Secondary | ICD-10-CM | POA: Diagnosis not present

## 2020-05-08 DIAGNOSIS — R143 Flatulence: Secondary | ICD-10-CM | POA: Diagnosis not present

## 2020-05-08 DIAGNOSIS — R109 Unspecified abdominal pain: Secondary | ICD-10-CM | POA: Diagnosis not present

## 2020-05-27 DIAGNOSIS — E1142 Type 2 diabetes mellitus with diabetic polyneuropathy: Secondary | ICD-10-CM | POA: Diagnosis not present

## 2020-05-27 DIAGNOSIS — E1129 Type 2 diabetes mellitus with other diabetic kidney complication: Secondary | ICD-10-CM | POA: Diagnosis not present

## 2020-05-27 DIAGNOSIS — Z794 Long term (current) use of insulin: Secondary | ICD-10-CM | POA: Diagnosis not present

## 2020-05-27 DIAGNOSIS — R809 Proteinuria, unspecified: Secondary | ICD-10-CM | POA: Diagnosis not present

## 2020-06-01 DIAGNOSIS — E11621 Type 2 diabetes mellitus with foot ulcer: Secondary | ICD-10-CM | POA: Diagnosis not present

## 2020-06-01 DIAGNOSIS — M79672 Pain in left foot: Secondary | ICD-10-CM | POA: Diagnosis not present

## 2020-06-01 DIAGNOSIS — L97522 Non-pressure chronic ulcer of other part of left foot with fat layer exposed: Secondary | ICD-10-CM | POA: Diagnosis not present

## 2020-06-04 DIAGNOSIS — J441 Chronic obstructive pulmonary disease with (acute) exacerbation: Secondary | ICD-10-CM | POA: Diagnosis not present

## 2020-06-04 DIAGNOSIS — E119 Type 2 diabetes mellitus without complications: Secondary | ICD-10-CM | POA: Diagnosis not present

## 2020-06-08 DIAGNOSIS — E1142 Type 2 diabetes mellitus with diabetic polyneuropathy: Secondary | ICD-10-CM | POA: Diagnosis not present

## 2020-06-08 DIAGNOSIS — L97522 Non-pressure chronic ulcer of other part of left foot with fat layer exposed: Secondary | ICD-10-CM | POA: Diagnosis not present

## 2020-06-14 DIAGNOSIS — K2100 Gastro-esophageal reflux disease with esophagitis, without bleeding: Secondary | ICD-10-CM | POA: Diagnosis not present

## 2020-06-14 DIAGNOSIS — M545 Low back pain, unspecified: Secondary | ICD-10-CM | POA: Diagnosis not present

## 2020-06-14 DIAGNOSIS — K5909 Other constipation: Secondary | ICD-10-CM | POA: Diagnosis not present

## 2020-06-14 DIAGNOSIS — G8929 Other chronic pain: Secondary | ICD-10-CM | POA: Diagnosis not present

## 2020-06-15 DIAGNOSIS — E11621 Type 2 diabetes mellitus with foot ulcer: Secondary | ICD-10-CM | POA: Diagnosis not present

## 2020-06-15 DIAGNOSIS — L97522 Non-pressure chronic ulcer of other part of left foot with fat layer exposed: Secondary | ICD-10-CM | POA: Diagnosis not present

## 2020-06-22 DIAGNOSIS — L97522 Non-pressure chronic ulcer of other part of left foot with fat layer exposed: Secondary | ICD-10-CM | POA: Diagnosis not present

## 2020-07-05 DIAGNOSIS — E119 Type 2 diabetes mellitus without complications: Secondary | ICD-10-CM | POA: Diagnosis not present

## 2020-07-05 DIAGNOSIS — J441 Chronic obstructive pulmonary disease with (acute) exacerbation: Secondary | ICD-10-CM | POA: Diagnosis not present

## 2020-07-06 DIAGNOSIS — L97522 Non-pressure chronic ulcer of other part of left foot with fat layer exposed: Secondary | ICD-10-CM | POA: Diagnosis not present

## 2020-07-13 DIAGNOSIS — L97522 Non-pressure chronic ulcer of other part of left foot with fat layer exposed: Secondary | ICD-10-CM | POA: Diagnosis not present

## 2020-07-13 DIAGNOSIS — E11621 Type 2 diabetes mellitus with foot ulcer: Secondary | ICD-10-CM | POA: Diagnosis not present

## 2020-07-27 DIAGNOSIS — L97522 Non-pressure chronic ulcer of other part of left foot with fat layer exposed: Secondary | ICD-10-CM | POA: Diagnosis not present

## 2020-07-28 DIAGNOSIS — F17210 Nicotine dependence, cigarettes, uncomplicated: Secondary | ICD-10-CM | POA: Diagnosis not present

## 2020-07-28 DIAGNOSIS — J410 Simple chronic bronchitis: Secondary | ICD-10-CM | POA: Diagnosis not present

## 2020-07-28 DIAGNOSIS — E119 Type 2 diabetes mellitus without complications: Secondary | ICD-10-CM | POA: Diagnosis not present

## 2020-07-28 DIAGNOSIS — Z008 Encounter for other general examination: Secondary | ICD-10-CM | POA: Diagnosis not present

## 2020-07-28 DIAGNOSIS — K589 Irritable bowel syndrome without diarrhea: Secondary | ICD-10-CM | POA: Diagnosis not present

## 2020-07-28 DIAGNOSIS — I10 Essential (primary) hypertension: Secondary | ICD-10-CM | POA: Diagnosis not present

## 2020-07-28 DIAGNOSIS — E785 Hyperlipidemia, unspecified: Secondary | ICD-10-CM | POA: Diagnosis not present

## 2020-07-28 DIAGNOSIS — H3530 Unspecified macular degeneration: Secondary | ICD-10-CM | POA: Diagnosis not present

## 2020-08-04 DIAGNOSIS — E119 Type 2 diabetes mellitus without complications: Secondary | ICD-10-CM | POA: Diagnosis not present

## 2020-08-04 DIAGNOSIS — J441 Chronic obstructive pulmonary disease with (acute) exacerbation: Secondary | ICD-10-CM | POA: Diagnosis not present

## 2020-08-10 DIAGNOSIS — L97522 Non-pressure chronic ulcer of other part of left foot with fat layer exposed: Secondary | ICD-10-CM | POA: Diagnosis not present

## 2020-08-18 DIAGNOSIS — L97522 Non-pressure chronic ulcer of other part of left foot with fat layer exposed: Secondary | ICD-10-CM | POA: Diagnosis not present

## 2020-08-31 DIAGNOSIS — E1165 Type 2 diabetes mellitus with hyperglycemia: Secondary | ICD-10-CM | POA: Diagnosis not present

## 2020-08-31 DIAGNOSIS — Z794 Long term (current) use of insulin: Secondary | ICD-10-CM | POA: Diagnosis not present

## 2020-08-31 DIAGNOSIS — R809 Proteinuria, unspecified: Secondary | ICD-10-CM | POA: Diagnosis not present

## 2020-08-31 DIAGNOSIS — E1129 Type 2 diabetes mellitus with other diabetic kidney complication: Secondary | ICD-10-CM | POA: Diagnosis not present

## 2020-08-31 DIAGNOSIS — E1142 Type 2 diabetes mellitus with diabetic polyneuropathy: Secondary | ICD-10-CM | POA: Diagnosis not present

## 2020-08-31 DIAGNOSIS — L97522 Non-pressure chronic ulcer of other part of left foot with fat layer exposed: Secondary | ICD-10-CM | POA: Diagnosis not present

## 2020-09-04 DIAGNOSIS — J441 Chronic obstructive pulmonary disease with (acute) exacerbation: Secondary | ICD-10-CM | POA: Diagnosis not present

## 2020-09-04 DIAGNOSIS — E119 Type 2 diabetes mellitus without complications: Secondary | ICD-10-CM | POA: Diagnosis not present

## 2020-09-23 DIAGNOSIS — L97522 Non-pressure chronic ulcer of other part of left foot with fat layer exposed: Secondary | ICD-10-CM | POA: Diagnosis not present

## 2020-09-23 DIAGNOSIS — E11621 Type 2 diabetes mellitus with foot ulcer: Secondary | ICD-10-CM | POA: Diagnosis not present

## 2020-10-04 DIAGNOSIS — E119 Type 2 diabetes mellitus without complications: Secondary | ICD-10-CM | POA: Diagnosis not present

## 2020-10-04 DIAGNOSIS — J441 Chronic obstructive pulmonary disease with (acute) exacerbation: Secondary | ICD-10-CM | POA: Diagnosis not present

## 2020-10-12 DIAGNOSIS — M2042 Other hammer toe(s) (acquired), left foot: Secondary | ICD-10-CM | POA: Diagnosis not present

## 2020-10-12 DIAGNOSIS — E1142 Type 2 diabetes mellitus with diabetic polyneuropathy: Secondary | ICD-10-CM | POA: Diagnosis not present

## 2020-11-04 DIAGNOSIS — J441 Chronic obstructive pulmonary disease with (acute) exacerbation: Secondary | ICD-10-CM | POA: Diagnosis not present

## 2020-11-04 DIAGNOSIS — E119 Type 2 diabetes mellitus without complications: Secondary | ICD-10-CM | POA: Diagnosis not present

## 2020-12-05 DIAGNOSIS — E119 Type 2 diabetes mellitus without complications: Secondary | ICD-10-CM | POA: Diagnosis not present

## 2020-12-05 DIAGNOSIS — J441 Chronic obstructive pulmonary disease with (acute) exacerbation: Secondary | ICD-10-CM | POA: Diagnosis not present

## 2021-01-04 DIAGNOSIS — J441 Chronic obstructive pulmonary disease with (acute) exacerbation: Secondary | ICD-10-CM | POA: Diagnosis not present

## 2021-01-04 DIAGNOSIS — E119 Type 2 diabetes mellitus without complications: Secondary | ICD-10-CM | POA: Diagnosis not present

## 2021-01-24 DIAGNOSIS — H5203 Hypermetropia, bilateral: Secondary | ICD-10-CM | POA: Diagnosis not present

## 2021-01-24 DIAGNOSIS — H524 Presbyopia: Secondary | ICD-10-CM | POA: Diagnosis not present

## 2021-01-24 DIAGNOSIS — H35043 Retinal micro-aneurysms, unspecified, bilateral: Secondary | ICD-10-CM | POA: Diagnosis not present

## 2021-01-24 DIAGNOSIS — H26493 Other secondary cataract, bilateral: Secondary | ICD-10-CM | POA: Diagnosis not present

## 2021-01-24 DIAGNOSIS — E113313 Type 2 diabetes mellitus with moderate nonproliferative diabetic retinopathy with macular edema, bilateral: Secondary | ICD-10-CM | POA: Diagnosis not present

## 2021-01-24 DIAGNOSIS — H16223 Keratoconjunctivitis sicca, not specified as Sjogren's, bilateral: Secondary | ICD-10-CM | POA: Diagnosis not present

## 2021-01-24 DIAGNOSIS — H43393 Other vitreous opacities, bilateral: Secondary | ICD-10-CM | POA: Diagnosis not present

## 2021-01-24 DIAGNOSIS — Z794 Long term (current) use of insulin: Secondary | ICD-10-CM | POA: Diagnosis not present

## 2021-01-24 DIAGNOSIS — H52223 Regular astigmatism, bilateral: Secondary | ICD-10-CM | POA: Diagnosis not present

## 2021-01-24 DIAGNOSIS — Z961 Presence of intraocular lens: Secondary | ICD-10-CM | POA: Diagnosis not present

## 2021-01-27 DIAGNOSIS — E1142 Type 2 diabetes mellitus with diabetic polyneuropathy: Secondary | ICD-10-CM | POA: Diagnosis not present

## 2021-01-27 DIAGNOSIS — M2042 Other hammer toe(s) (acquired), left foot: Secondary | ICD-10-CM | POA: Diagnosis not present

## 2021-01-27 DIAGNOSIS — L84 Corns and callosities: Secondary | ICD-10-CM | POA: Diagnosis not present

## 2021-01-28 DIAGNOSIS — Z7984 Long term (current) use of oral hypoglycemic drugs: Secondary | ICD-10-CM | POA: Diagnosis not present

## 2021-01-28 DIAGNOSIS — E1165 Type 2 diabetes mellitus with hyperglycemia: Secondary | ICD-10-CM | POA: Diagnosis not present

## 2021-01-28 DIAGNOSIS — E1140 Type 2 diabetes mellitus with diabetic neuropathy, unspecified: Secondary | ICD-10-CM | POA: Diagnosis not present

## 2021-01-28 DIAGNOSIS — Z794 Long term (current) use of insulin: Secondary | ICD-10-CM | POA: Diagnosis not present

## 2021-01-28 DIAGNOSIS — E1129 Type 2 diabetes mellitus with other diabetic kidney complication: Secondary | ICD-10-CM | POA: Diagnosis not present

## 2021-01-28 DIAGNOSIS — R809 Proteinuria, unspecified: Secondary | ICD-10-CM | POA: Diagnosis not present

## 2021-02-04 DIAGNOSIS — E119 Type 2 diabetes mellitus without complications: Secondary | ICD-10-CM | POA: Diagnosis not present

## 2021-02-04 DIAGNOSIS — J441 Chronic obstructive pulmonary disease with (acute) exacerbation: Secondary | ICD-10-CM | POA: Diagnosis not present

## 2021-02-14 DIAGNOSIS — H26492 Other secondary cataract, left eye: Secondary | ICD-10-CM | POA: Diagnosis not present

## 2021-02-21 DIAGNOSIS — H26491 Other secondary cataract, right eye: Secondary | ICD-10-CM | POA: Diagnosis not present

## 2021-02-23 DIAGNOSIS — R0902 Hypoxemia: Secondary | ICD-10-CM | POA: Diagnosis not present

## 2021-02-23 DIAGNOSIS — J441 Chronic obstructive pulmonary disease with (acute) exacerbation: Secondary | ICD-10-CM | POA: Diagnosis not present

## 2021-02-23 DIAGNOSIS — R059 Cough, unspecified: Secondary | ICD-10-CM | POA: Diagnosis not present

## 2021-02-23 DIAGNOSIS — J449 Chronic obstructive pulmonary disease, unspecified: Secondary | ICD-10-CM | POA: Diagnosis not present

## 2021-02-23 DIAGNOSIS — I10 Essential (primary) hypertension: Secondary | ICD-10-CM | POA: Diagnosis not present

## 2021-02-23 DIAGNOSIS — Z794 Long term (current) use of insulin: Secondary | ICD-10-CM | POA: Diagnosis not present

## 2021-02-23 DIAGNOSIS — E1165 Type 2 diabetes mellitus with hyperglycemia: Secondary | ICD-10-CM | POA: Diagnosis not present

## 2021-02-23 DIAGNOSIS — K5909 Other constipation: Secondary | ICD-10-CM | POA: Diagnosis not present

## 2021-03-06 DIAGNOSIS — E119 Type 2 diabetes mellitus without complications: Secondary | ICD-10-CM | POA: Diagnosis not present

## 2021-03-06 DIAGNOSIS — J441 Chronic obstructive pulmonary disease with (acute) exacerbation: Secondary | ICD-10-CM | POA: Diagnosis not present

## 2021-03-23 DIAGNOSIS — H3589 Other specified retinal disorders: Secondary | ICD-10-CM | POA: Diagnosis not present

## 2021-03-23 DIAGNOSIS — Z794 Long term (current) use of insulin: Secondary | ICD-10-CM | POA: Diagnosis not present

## 2021-03-23 DIAGNOSIS — E113313 Type 2 diabetes mellitus with moderate nonproliferative diabetic retinopathy with macular edema, bilateral: Secondary | ICD-10-CM | POA: Diagnosis not present

## 2021-04-06 DIAGNOSIS — J441 Chronic obstructive pulmonary disease with (acute) exacerbation: Secondary | ICD-10-CM | POA: Diagnosis not present

## 2021-04-06 DIAGNOSIS — E119 Type 2 diabetes mellitus without complications: Secondary | ICD-10-CM | POA: Diagnosis not present

## 2021-04-18 DIAGNOSIS — Z7985 Long-term (current) use of injectable non-insulin antidiabetic drugs: Secondary | ICD-10-CM | POA: Diagnosis not present

## 2021-04-18 DIAGNOSIS — Z7984 Long term (current) use of oral hypoglycemic drugs: Secondary | ICD-10-CM | POA: Diagnosis not present

## 2021-04-18 DIAGNOSIS — E1165 Type 2 diabetes mellitus with hyperglycemia: Secondary | ICD-10-CM | POA: Diagnosis not present

## 2021-04-18 DIAGNOSIS — Z794 Long term (current) use of insulin: Secondary | ICD-10-CM | POA: Diagnosis not present

## 2021-04-18 DIAGNOSIS — R809 Proteinuria, unspecified: Secondary | ICD-10-CM | POA: Diagnosis not present

## 2021-04-18 DIAGNOSIS — E1140 Type 2 diabetes mellitus with diabetic neuropathy, unspecified: Secondary | ICD-10-CM | POA: Diagnosis not present

## 2021-04-18 DIAGNOSIS — E1129 Type 2 diabetes mellitus with other diabetic kidney complication: Secondary | ICD-10-CM | POA: Diagnosis not present

## 2021-05-07 DIAGNOSIS — E119 Type 2 diabetes mellitus without complications: Secondary | ICD-10-CM | POA: Diagnosis not present

## 2021-05-07 DIAGNOSIS — J441 Chronic obstructive pulmonary disease with (acute) exacerbation: Secondary | ICD-10-CM | POA: Diagnosis not present

## 2021-06-08 DIAGNOSIS — R1111 Vomiting without nausea: Secondary | ICD-10-CM | POA: Diagnosis not present

## 2021-06-08 DIAGNOSIS — I10 Essential (primary) hypertension: Secondary | ICD-10-CM | POA: Diagnosis not present

## 2021-06-08 DIAGNOSIS — R0902 Hypoxemia: Secondary | ICD-10-CM | POA: Diagnosis not present

## 2021-06-08 DIAGNOSIS — R1013 Epigastric pain: Secondary | ICD-10-CM | POA: Diagnosis not present

## 2021-06-08 DIAGNOSIS — H903 Sensorineural hearing loss, bilateral: Secondary | ICD-10-CM | POA: Diagnosis not present

## 2021-06-08 DIAGNOSIS — M79604 Pain in right leg: Secondary | ICD-10-CM | POA: Diagnosis not present

## 2021-06-08 DIAGNOSIS — M79605 Pain in left leg: Secondary | ICD-10-CM | POA: Diagnosis not present

## 2021-07-19 DIAGNOSIS — E1140 Type 2 diabetes mellitus with diabetic neuropathy, unspecified: Secondary | ICD-10-CM | POA: Diagnosis not present

## 2021-07-19 DIAGNOSIS — Z7984 Long term (current) use of oral hypoglycemic drugs: Secondary | ICD-10-CM | POA: Diagnosis not present

## 2021-07-19 DIAGNOSIS — E1129 Type 2 diabetes mellitus with other diabetic kidney complication: Secondary | ICD-10-CM | POA: Diagnosis not present

## 2021-07-19 DIAGNOSIS — Z7985 Long-term (current) use of injectable non-insulin antidiabetic drugs: Secondary | ICD-10-CM | POA: Diagnosis not present

## 2021-07-19 DIAGNOSIS — Z794 Long term (current) use of insulin: Secondary | ICD-10-CM | POA: Diagnosis not present

## 2021-07-19 DIAGNOSIS — R809 Proteinuria, unspecified: Secondary | ICD-10-CM | POA: Diagnosis not present

## 2021-07-23 DIAGNOSIS — S8992XA Unspecified injury of left lower leg, initial encounter: Secondary | ICD-10-CM | POA: Diagnosis not present

## 2021-07-23 DIAGNOSIS — M25462 Effusion, left knee: Secondary | ICD-10-CM | POA: Diagnosis not present

## 2021-07-23 DIAGNOSIS — I10 Essential (primary) hypertension: Secondary | ICD-10-CM | POA: Diagnosis not present

## 2021-07-23 DIAGNOSIS — M25562 Pain in left knee: Secondary | ICD-10-CM | POA: Diagnosis not present

## 2021-10-03 DIAGNOSIS — R030 Elevated blood-pressure reading, without diagnosis of hypertension: Secondary | ICD-10-CM | POA: Diagnosis not present

## 2021-10-03 DIAGNOSIS — N390 Urinary tract infection, site not specified: Secondary | ICD-10-CM | POA: Diagnosis not present

## 2021-10-03 DIAGNOSIS — R1011 Right upper quadrant pain: Secondary | ICD-10-CM | POA: Diagnosis not present

## 2021-10-03 DIAGNOSIS — S299XXA Unspecified injury of thorax, initial encounter: Secondary | ICD-10-CM | POA: Diagnosis not present

## 2021-10-03 DIAGNOSIS — J984 Other disorders of lung: Secondary | ICD-10-CM | POA: Diagnosis not present

## 2021-10-03 DIAGNOSIS — S3991XA Unspecified injury of abdomen, initial encounter: Secondary | ICD-10-CM | POA: Diagnosis not present

## 2021-10-03 DIAGNOSIS — R1010 Upper abdominal pain, unspecified: Secondary | ICD-10-CM | POA: Diagnosis not present

## 2021-10-03 DIAGNOSIS — R918 Other nonspecific abnormal finding of lung field: Secondary | ICD-10-CM | POA: Diagnosis not present

## 2021-10-24 DIAGNOSIS — K5909 Other constipation: Secondary | ICD-10-CM | POA: Diagnosis not present

## 2021-10-24 DIAGNOSIS — E1165 Type 2 diabetes mellitus with hyperglycemia: Secondary | ICD-10-CM | POA: Diagnosis not present

## 2021-10-24 DIAGNOSIS — Z794 Long term (current) use of insulin: Secondary | ICD-10-CM | POA: Diagnosis not present

## 2021-10-24 DIAGNOSIS — I10 Essential (primary) hypertension: Secondary | ICD-10-CM | POA: Diagnosis not present

## 2021-10-24 DIAGNOSIS — J9801 Acute bronchospasm: Secondary | ICD-10-CM | POA: Diagnosis not present

## 2021-10-24 DIAGNOSIS — K2100 Gastro-esophageal reflux disease with esophagitis, without bleeding: Secondary | ICD-10-CM | POA: Diagnosis not present

## 2021-12-21 DIAGNOSIS — Z87891 Personal history of nicotine dependence: Secondary | ICD-10-CM | POA: Diagnosis not present

## 2021-12-21 DIAGNOSIS — I10 Essential (primary) hypertension: Secondary | ICD-10-CM | POA: Diagnosis not present

## 2021-12-21 DIAGNOSIS — E1129 Type 2 diabetes mellitus with other diabetic kidney complication: Secondary | ICD-10-CM | POA: Diagnosis not present

## 2021-12-21 DIAGNOSIS — E1140 Type 2 diabetes mellitus with diabetic neuropathy, unspecified: Secondary | ICD-10-CM | POA: Diagnosis not present

## 2021-12-21 DIAGNOSIS — Z794 Long term (current) use of insulin: Secondary | ICD-10-CM | POA: Diagnosis not present

## 2021-12-21 DIAGNOSIS — Z7984 Long term (current) use of oral hypoglycemic drugs: Secondary | ICD-10-CM | POA: Diagnosis not present

## 2021-12-21 DIAGNOSIS — R809 Proteinuria, unspecified: Secondary | ICD-10-CM | POA: Diagnosis not present

## 2021-12-21 DIAGNOSIS — Z7985 Long-term (current) use of injectable non-insulin antidiabetic drugs: Secondary | ICD-10-CM | POA: Diagnosis not present

## 2021-12-21 DIAGNOSIS — E1165 Type 2 diabetes mellitus with hyperglycemia: Secondary | ICD-10-CM | POA: Diagnosis not present

## 2021-12-28 DIAGNOSIS — E1165 Type 2 diabetes mellitus with hyperglycemia: Secondary | ICD-10-CM | POA: Diagnosis not present

## 2022-01-12 DIAGNOSIS — B9689 Other specified bacterial agents as the cause of diseases classified elsewhere: Secondary | ICD-10-CM | POA: Diagnosis not present

## 2022-01-12 DIAGNOSIS — E119 Type 2 diabetes mellitus without complications: Secondary | ICD-10-CM | POA: Diagnosis not present

## 2022-01-12 DIAGNOSIS — Z1159 Encounter for screening for other viral diseases: Secondary | ICD-10-CM | POA: Diagnosis not present

## 2022-01-12 DIAGNOSIS — L309 Dermatitis, unspecified: Secondary | ICD-10-CM | POA: Diagnosis not present

## 2022-01-17 DIAGNOSIS — S90821A Blister (nonthermal), right foot, initial encounter: Secondary | ICD-10-CM | POA: Diagnosis not present

## 2022-01-28 DIAGNOSIS — E1165 Type 2 diabetes mellitus with hyperglycemia: Secondary | ICD-10-CM | POA: Diagnosis not present

## 2022-02-11 DIAGNOSIS — S91331A Puncture wound without foreign body, right foot, initial encounter: Secondary | ICD-10-CM | POA: Diagnosis not present

## 2022-02-11 DIAGNOSIS — W25XXXA Contact with sharp glass, initial encounter: Secondary | ICD-10-CM | POA: Diagnosis not present

## 2022-02-23 DIAGNOSIS — M79671 Pain in right foot: Secondary | ICD-10-CM | POA: Diagnosis not present

## 2022-02-23 DIAGNOSIS — I739 Peripheral vascular disease, unspecified: Secondary | ICD-10-CM | POA: Diagnosis not present

## 2022-02-23 DIAGNOSIS — S90851A Superficial foreign body, right foot, initial encounter: Secondary | ICD-10-CM | POA: Diagnosis not present

## 2022-02-27 DIAGNOSIS — E1165 Type 2 diabetes mellitus with hyperglycemia: Secondary | ICD-10-CM | POA: Diagnosis not present

## 2022-02-28 ENCOUNTER — Encounter (HOSPITAL_BASED_OUTPATIENT_CLINIC_OR_DEPARTMENT_OTHER): Payer: Self-pay | Admitting: Emergency Medicine

## 2022-02-28 ENCOUNTER — Emergency Department (HOSPITAL_BASED_OUTPATIENT_CLINIC_OR_DEPARTMENT_OTHER): Payer: Medicare Other

## 2022-02-28 ENCOUNTER — Emergency Department (HOSPITAL_BASED_OUTPATIENT_CLINIC_OR_DEPARTMENT_OTHER)
Admission: EM | Admit: 2022-02-28 | Discharge: 2022-02-28 | Disposition: A | Discharge status: Home / Self Care | Payer: Medicare Other | Attending: Emergency Medicine | Admitting: Emergency Medicine

## 2022-02-28 DIAGNOSIS — R269 Unspecified abnormalities of gait and mobility: Secondary | ICD-10-CM | POA: Insufficient documentation

## 2022-02-28 DIAGNOSIS — R410 Disorientation, unspecified: Secondary | ICD-10-CM | POA: Diagnosis not present

## 2022-02-28 DIAGNOSIS — D72829 Elevated white blood cell count, unspecified: Secondary | ICD-10-CM | POA: Diagnosis not present

## 2022-02-28 DIAGNOSIS — Z794 Long term (current) use of insulin: Secondary | ICD-10-CM | POA: Diagnosis not present

## 2022-02-28 DIAGNOSIS — I10 Essential (primary) hypertension: Secondary | ICD-10-CM | POA: Diagnosis not present

## 2022-02-28 DIAGNOSIS — Z853 Personal history of malignant neoplasm of breast: Secondary | ICD-10-CM | POA: Insufficient documentation

## 2022-02-28 DIAGNOSIS — J449 Chronic obstructive pulmonary disease, unspecified: Secondary | ICD-10-CM | POA: Insufficient documentation

## 2022-02-28 DIAGNOSIS — E1165 Type 2 diabetes mellitus with hyperglycemia: Secondary | ICD-10-CM | POA: Diagnosis not present

## 2022-02-28 DIAGNOSIS — Z79899 Other long term (current) drug therapy: Secondary | ICD-10-CM | POA: Insufficient documentation

## 2022-02-28 DIAGNOSIS — R2681 Unsteadiness on feet: Secondary | ICD-10-CM

## 2022-02-28 DIAGNOSIS — S62011A Displaced fracture of distal pole of navicular [scaphoid] bone of right wrist, initial encounter for closed fracture: Secondary | ICD-10-CM | POA: Diagnosis not present

## 2022-02-28 DIAGNOSIS — R42 Dizziness and giddiness: Secondary | ICD-10-CM | POA: Diagnosis not present

## 2022-02-28 DIAGNOSIS — M25531 Pain in right wrist: Secondary | ICD-10-CM | POA: Diagnosis not present

## 2022-02-28 LAB — CBC WITH DIFFERENTIAL/PLATELET
Abs Immature Granulocytes: 0.06 10*3/uL (ref 0.00–0.07)
Basophils Absolute: 0 10*3/uL (ref 0.0–0.1)
Basophils Relative: 0 %
Eosinophils Absolute: 0 10*3/uL (ref 0.0–0.5)
Eosinophils Relative: 0 %
HCT: 37.8 % (ref 36.0–46.0)
Hemoglobin: 12.6 g/dL (ref 12.0–15.0)
Immature Granulocytes: 1 %
Lymphocytes Relative: 6 %
Lymphs Abs: 0.8 10*3/uL (ref 0.7–4.0)
MCH: 28.9 pg (ref 26.0–34.0)
MCHC: 33.3 g/dL (ref 30.0–36.0)
MCV: 86.7 fL (ref 80.0–100.0)
Monocytes Absolute: 0.8 10*3/uL (ref 0.1–1.0)
Monocytes Relative: 6 %
Neutro Abs: 11 10*3/uL — ABNORMAL HIGH (ref 1.7–7.7)
Neutrophils Relative %: 87 %
Platelets: 221 10*3/uL (ref 150–400)
RBC: 4.36 MIL/uL (ref 3.87–5.11)
RDW: 13 % (ref 11.5–15.5)
WBC: 12.7 10*3/uL — ABNORMAL HIGH (ref 4.0–10.5)
nRBC: 0 % (ref 0.0–0.2)

## 2022-02-28 LAB — COMPREHENSIVE METABOLIC PANEL
ALT: 15 U/L (ref 0–44)
AST: 24 U/L (ref 15–41)
Albumin: 3.4 g/dL — ABNORMAL LOW (ref 3.5–5.0)
Alkaline Phosphatase: 106 U/L (ref 38–126)
Anion gap: 11 (ref 5–15)
BUN: 25 mg/dL — ABNORMAL HIGH (ref 8–23)
CO2: 30 mmol/L (ref 22–32)
Calcium: 9.9 mg/dL (ref 8.9–10.3)
Chloride: 97 mmol/L — ABNORMAL LOW (ref 98–111)
Creatinine, Ser: 1.02 mg/dL — ABNORMAL HIGH (ref 0.44–1.00)
GFR, Estimated: 59 mL/min — ABNORMAL LOW (ref >60)
Glucose, Bld: 304 mg/dL — ABNORMAL HIGH (ref 70–99)
Potassium: 3.8 mmol/L (ref 3.5–5.1)
Sodium: 138 mmol/L (ref 135–145)
Total Bilirubin: 0.5 mg/dL (ref 0.3–1.2)
Total Protein: 7.9 g/dL (ref 6.5–8.1)

## 2022-02-28 MED ORDER — LACTATED RINGERS IV BOLUS
1000.0000 mL | Freq: Once | INTRAVENOUS | Status: AC
  Administered 2022-02-28: 1000 mL via INTRAVENOUS

## 2022-02-28 NOTE — ED Provider Notes (Addendum)
[facility] EMERGENCY DEPARTMENT Provider Note   CSN: [id number] Arrival date & time: 02/28/22  1235     History  Chief Complaint  Patient presents with   Gait Problem   Dizziness    Sandra Wade is a 71 y.o. female. Past Medical History:  Diagnosis Date   Breast cancer (HCC) 1993   Breast disorder 1993   Left breast cancer    COPD (chronic obstructive pulmonary disease) (HCC)    Diabetes mellitus without complication (HCC)    Hypertension    Mental disorder      Dizziness 2 months difficulty walking. Denies dizziness, pre-syncope, palpitations, chest pain or shortness of breath. Feels like foot disappears sometimes while she's walking. Endorses hallucinations briefly in front of her while walking but says this isn't what causes her to fall. Denies blurry vision, headaches or any pain. Feels like her blood sugar could be getting low occasionally. Blood sugar can drop after lantus in the evening but says this doesn't correspond with feeling wobbly. Denies numbness or tingling. Doesn't feel shaky when she's wobbling. Denies fevers, chills. Denies headaches, vision changes. One fall where she caught herself with her hands about a month ago but none since. Didn't not have any pain at either wrist. Takes benazepril, amlodipine, clonidine (twice daily). Takes Xanax.  Doesn't take meds for COPD. Does have supplemental oxygen at home and uses it for a few minutes here and there if she feels like she needs it. Sees podiatry. Reports getting piece of glass stuck in right foot 2-3 weeks ago. Was not able to complete antibiotics because she couldn't swallow the pills.  Reports history of left breast cancer in 1993 now s/p surgery and chemotherapy.      Home Medications Prior to Admission medications   Medication Sig Start Date End Date Taking? Authorizing Provider  ALPRAZolam Prudy Feeler) 1 MG tablet Take 1 mg by mouth at bedtime as needed for anxiety. 02/25/18   [provider]  amLODipine (NORVASC) 10 MG tablet Take 10 mg by mouth daily.    [provider]  benazepril (LOTENSIN) 40 MG tablet Take 40 mg by mouth daily.    [provider]  cloNIDine (CATAPRES) 0.1 MG tablet Take 1 tablet (0.1 mg total) by mouth at bedtime. 03/05/18   Zannie Cove, MD  divalproex (DEPAKOTE) 500 MG DR tablet Take 500 mg by mouth 2 (two) times daily.  06/23/16   [provider]  LANTUS SOLOSTAR 100 UNIT/ML Solostar Pen Inject 10 Units into the skin at bedtime.  09/11/16   [provider]  methadone (DOLOPHINE) 10 MG tablet Take 90 mg by mouth daily.     [provider]  OLANZapine (ZYPREXA) 10 MG tablet Take 10 mg by mouth at bedtime. 11/08/17   [provider]  ONE TOUCH ULTRA TEST test strip  09/11/16   [provider]  VICTOZA 18 MG/3ML SOPN Inject 18 mg into the skin every morning.  09/11/16   [provider]      Allergies    Patient has no known allergies.    Review of Systems   Review of Systems  Neurological:  Positive for dizziness.    Physical Exam Updated Vital Signs BP (!) 165/80 (BP Location: Left Arm)   Pulse 92   Temp 98.7 F (37.1 C)   Resp 18   Ht 5\' 3"  (1.6 m)   Wt 62.6 kg   SpO2 (!) 88%   BMI 24.45 kg/m  Physical Exam  Constitutional:      General: She is not in acute distress.    Appearance: She is not ill-appearing.  HENT:     Head: Normocephalic and atraumatic.  Eyes:     Extraocular Movements: Extraocular movements intact.  Cardiovascular:     Rate and Rhythm: Normal rate and regular rhythm.  Pulmonary:     Effort: Pulmonary effort is normal. No respiratory distress.     Breath sounds: Normal breath sounds. No wheezing or rales.  Musculoskeletal:     Comments: Trace edema at lower extremities bilaterally  Neurological:     Mental Status: She is alert and oriented to person, place, and time.     Cranial Nerves: No cranial nerve deficit.     Sensory: No  sensory deficit.     Motor: No weakness.     Comments: Patient able to ambulate though with some shuffling. Romberg negative.  Psychiatric:        Mood and Affect: Mood normal.        Behavior: Behavior normal.     ED Results / Procedures / Treatments   Labs (all labs ordered are listed, but only abnormal results are displayed) Labs Reviewed - No data to display  EKG None  Radiology No results found.  Procedures Procedures    Medications Ordered in ED Medications - No data to display  ED Course/ Medical Decision Making/ A&P                           Medical Decision Making Amount and/or Complexity of Data Reviewed Labs: ordered. Radiology: ordered.   Patient presents with 2 months of difficulty ambulating. Multiple possible etiologies including orthostatic hypotension, hypoglycemic episodes, medication induced hypotension/hypovolemia, electrolyte derangement, anemia, stroke, arrhythmia. Low suspicion for stroke or arrhythmia given history and exam. CBC with mild leukocytosis, CMP with hyperglycemia (glucose 304). Provided 1L LR bolus. Ct head wo negative for acute intracranial process including hemorrhage, mass lesion, or acute CVA.  Will discharge to PCP follow-up with referral to neurology. Patient has splint at left wrist for scaphoid waist fracture and already has appointment scheduled with orthopedics.        Final Clinical Impression(s) / ED Diagnoses Final diagnoses:  None    Rx / DC Orders ED Discharge Orders     None         Adron Bene, MD 02/28/22 1511    Adron Bene, MD 02/28/22 1521    Gwyneth Sprout, MD 03/01/22 2211

## 2022-02-28 NOTE — ED Notes (Signed)
Pt having difficulty with her phone. Assisted in calling someone for a ride home before getting dressed for DC.

## 2022-02-28 NOTE — ED Triage Notes (Signed)
Pt sent from [facility] . Pt states she has been feeling  dizzy and lightheaded for 3 weeks. Feels like she is swaying side to side and has caught herself falling. No injury. Also complains of right arm swelling for a few days. Feels like feet do not stay flat on the ground.

## 2022-02-28 NOTE — Discharge Instructions (Signed)
You came in with balance issues going on for a couple months. Head imaging and labs have ruled out any life threatening illnesses that require immediate attention. Please follow up with your primary care provider in the next week or so. Please reach out to [facility] to schedule an appointment to be seen by them as soon as you can. If your symptoms worsen or fail to improve, please seek medical attention.

## 2022-02-28 NOTE — ED Notes (Signed)
Called lab for add-on. Tubes holding.

## 2022-02-28 NOTE — ED Notes (Signed)
Dc instructions reviewed with pt no questions or concerns at this time. Pt taken to lobby to wait for friend to transport home

## 2022-03-02 DIAGNOSIS — W1839XA Other fall on same level, initial encounter: Secondary | ICD-10-CM | POA: Diagnosis not present

## 2022-03-02 DIAGNOSIS — R0602 Shortness of breath: Secondary | ICD-10-CM | POA: Diagnosis not present

## 2022-03-02 DIAGNOSIS — S62021A Displaced fracture of middle third of navicular [scaphoid] bone of right wrist, initial encounter for closed fracture: Secondary | ICD-10-CM | POA: Diagnosis not present

## 2022-03-02 DIAGNOSIS — R42 Dizziness and giddiness: Secondary | ICD-10-CM | POA: Diagnosis not present

## 2022-03-02 DIAGNOSIS — I6782 Cerebral ischemia: Secondary | ICD-10-CM | POA: Diagnosis not present

## 2022-03-02 DIAGNOSIS — Y998 Other external cause status: Secondary | ICD-10-CM | POA: Diagnosis not present

## 2022-03-06 DIAGNOSIS — I739 Peripheral vascular disease, unspecified: Secondary | ICD-10-CM | POA: Diagnosis not present

## 2022-03-07 ENCOUNTER — Ambulatory Visit: Payer: Medicare Other | Admitting: Neurology

## 2022-03-07 ENCOUNTER — Encounter: Payer: Self-pay | Admitting: Neurology

## 2022-03-07 DIAGNOSIS — E11621 Type 2 diabetes mellitus with foot ulcer: Secondary | ICD-10-CM | POA: Diagnosis not present

## 2022-03-07 DIAGNOSIS — J449 Chronic obstructive pulmonary disease, unspecified: Secondary | ICD-10-CM | POA: Diagnosis not present

## 2022-03-07 DIAGNOSIS — R053 Chronic cough: Secondary | ICD-10-CM | POA: Diagnosis not present

## 2022-03-07 DIAGNOSIS — L97522 Non-pressure chronic ulcer of other part of left foot with fat layer exposed: Secondary | ICD-10-CM | POA: Diagnosis not present

## 2022-03-30 DIAGNOSIS — J9611 Chronic respiratory failure with hypoxia: Secondary | ICD-10-CM | POA: Diagnosis not present

## 2022-03-30 DIAGNOSIS — J449 Chronic obstructive pulmonary disease, unspecified: Secondary | ICD-10-CM | POA: Diagnosis not present

## 2022-03-30 DIAGNOSIS — E1165 Type 2 diabetes mellitus with hyperglycemia: Secondary | ICD-10-CM | POA: Diagnosis not present

## 2022-03-30 DIAGNOSIS — Z87891 Personal history of nicotine dependence: Secondary | ICD-10-CM | POA: Diagnosis not present

## 2022-03-31 DIAGNOSIS — J9611 Chronic respiratory failure with hypoxia: Secondary | ICD-10-CM | POA: Diagnosis not present

## 2022-04-12 DIAGNOSIS — S62011A Displaced fracture of distal pole of navicular [scaphoid] bone of right wrist, initial encounter for closed fracture: Secondary | ICD-10-CM | POA: Diagnosis not present

## 2022-04-20 DIAGNOSIS — Z794 Long term (current) use of insulin: Secondary | ICD-10-CM | POA: Diagnosis not present

## 2022-04-20 DIAGNOSIS — E1165 Type 2 diabetes mellitus with hyperglycemia: Secondary | ICD-10-CM | POA: Diagnosis not present

## 2022-04-23 ENCOUNTER — Emergency Department (HOSPITAL_BASED_OUTPATIENT_CLINIC_OR_DEPARTMENT_OTHER)
Admission: EM | Admit: 2022-04-23 | Discharge: 2022-04-23 | Disposition: A | Discharge status: Home / Self Care | Payer: 59 | Attending: Emergency Medicine | Admitting: Emergency Medicine

## 2022-04-23 ENCOUNTER — Other Ambulatory Visit: Payer: Self-pay

## 2022-04-23 DIAGNOSIS — M25531 Pain in right wrist: Secondary | ICD-10-CM | POA: Insufficient documentation

## 2022-04-23 DIAGNOSIS — Z794 Long term (current) use of insulin: Secondary | ICD-10-CM | POA: Diagnosis not present

## 2022-04-23 MED ORDER — HYDROMORPHONE HCL 1 MG/ML IJ SOLN: 1.0000 mg | Freq: Once | INTRAMUSCULAR | Status: DC

## 2022-04-23 NOTE — Discharge Instructions (Addendum)
You were evaluated in the emergency department today for right hand/wrist pain.  Your cast appeared too tight, thus it was removed and you were placed into a splint.  Do not remove this splint.  It is very important to call your orthopedic office first thing Monday/tomorrow morning to schedule your follow-up appointment as soon as possible, within the next 2-3 days.  Please be sure to tell them that you came to the emergency department, your hand was hurting, your fingers were swollen, and you were having .  Also be sure to asked them how your CT of your wrist can be rescheduled and higher transportation can be oriented for this.  Do not get the splint wet, further information and instructions have been provided for you in your discharge paperwork as we discussed.  Return to the ED for new or worsening symptoms as discussed.

## 2022-04-23 NOTE — ED Notes (Signed)
Pt refused splint. PA made aware and will round on pt.

## 2022-04-23 NOTE — ED Provider Notes (Signed)
[facility] Provider Note   CSN: [id number] Arrival date & time: 04/23/22  1228     History  Chief Complaint  Patient presents with   Arm Pain    Sandra Wade is a 72 y.o. female recently seen and evaluated on 04/12/2022 by Dr. Rowe Robert of [facility] the orthopedics for a displaced fracture of the distal pole of the navicular bone.  Patient was placed in a cast that day.  3 days ago, patient began noticing increasing pain and swelling in the right hand.  Patient states sometimes when she moves her fingers they become more tingly than before, believes the cast is too tight.  Is supposed to have a CT scan tomorrow for further evaluation of the wrist, for which pt states she is unaware of this.  The history is provided by the patient and medical records.  Arm Pain     Home Medications Prior to Admission medications   Medication Sig Start Date End Date Taking? Authorizing Provider  ALPRAZolam Duanne Moron) 1 MG tablet Take 1 mg by mouth at bedtime as needed for anxiety. 02/25/18   [provider]  amLODipine (NORVASC) 10 MG tablet Take 10 mg by mouth daily.    [provider]  benazepril (LOTENSIN) 40 MG tablet Take 40 mg by mouth daily.    [provider]  cloNIDine (CATAPRES) 0.1 MG tablet Take 1 tablet (0.1 mg total) by mouth at bedtime. 03/05/18   Domenic Polite, MD  divalproex (DEPAKOTE) 500 MG DR tablet Take 500 mg by mouth 2 (two) times daily.  06/23/16   [provider]  LANTUS SOLOSTAR 100 UNIT/ML Solostar Pen Inject 10 Units into the skin at bedtime.  09/11/16   [provider]  methadone (DOLOPHINE) 10 MG tablet Take 90 mg by mouth daily.     [provider]  OLANZapine (ZYPREXA) 10 MG tablet Take 10 mg by mouth at bedtime. 11/08/17   [provider]  ONE TOUCH ULTRA TEST test strip  09/11/16   [provider]  VICTOZA 18 MG/3ML SOPN Inject 18 mg into the skin every morning.   09/11/16   [provider]      Allergies    Patient has no known allergies.    Review of Systems   Review of Systems  Musculoskeletal:        Wrist pain, finger swelling    Physical Exam Updated Vital Signs BP (!) 159/69 (BP Location: Left Arm)   Pulse 62   Temp 98.1 F (36.7 C) (Oral)   Resp 18   Ht '5\' 3"'$  (1.6 m)   Wt 62.6 kg   SpO2 97%   BMI 24.45 kg/m  Physical Exam Vitals and nursing note reviewed.  Constitutional:      General: She is not in acute distress.    Appearance: She is well-developed.  HENT:     Head: Normocephalic and atraumatic.  Eyes:     Conjunctiva/sclera: Conjunctivae normal.  Cardiovascular:     Rate and Rhythm: Normal rate and regular rhythm.     Heart sounds: No murmur heard. Pulmonary:     Effort: Pulmonary effort is normal. No respiratory distress.  Abdominal:     Palpations: Abdomen is soft.     Tenderness: There is no abdominal tenderness.  Musculoskeletal:        General: Swelling and tenderness present.     Cervical back: Neck supple.     Comments: Patient with short arm  cast placed on the right upper extremity.  Tenderness and mild swelling of all 5 digits of the right hand.  Tenderness to palpation of the right hand/wrist when cast is palpated.  Capillary refill mildly decreased, 2 to 3 seconds.  Still able to wiggle fingers, though elicits some subjective pain.  Extremity does not appear pale or cyanotic.  Skin:    General: Skin is warm and dry.     Capillary Refill: Capillary refill takes less than 2 seconds.  Neurological:     Mental Status: She is alert.  Psychiatric:        Mood and Affect: Mood normal.    ED Results / Procedures / Treatments   Labs (all labs ordered are listed, but only abnormal results are displayed) Labs Reviewed - No data to display  EKG None  Radiology No results found.  Procedures Procedures    Medications Ordered in ED Medications - No data to display  ED Course/ Medical  Decision Making/ A&P Clinical Course as of 04/23/22 1853  Sun Apr 23, 2022  1547 Stable 48 YOF with scaphoid fracture. CT tomorrow for eval for necrosis. Severe pain.  Cast removed and symptoms grossly improved. No evidence of compartment syndrome at this time.  Patient placed in an looser fitting splint and follow-up with her walk in-orthopedics tomorrow for further care and management. [CC]    Clinical Course User Index [CC] Tretha Sciara, MD                             Medical Decision Making  Patient is 72 year old female presenting to the ED due to right arm pain over the last 1 to 2 days.  Patient currently presents in a right short arm cast.  Currently being treated/evaluated for scaphoid fracture by [facility] orthopedics Dr Rowe Robert.    Orthopedic notes from 04/12/2022 visit with Dr. Rowe Robert reviewed, diagnosis of displaced fracture of distal pole of navicular (scaphoid) bone of right wrist, initial encounter for closed fracture.  Patient reportedly has CT tomorrow for further evaluation for possible scaphoid fracture complication.  On exam, swelling of digits with painful ROM and mildly increased CRT suggestive of reduced blood flow, however low suspicion for severe ischemia such as compartment syndrome at this time.  Due to these findings, believe patient would best benefit from removal of cast and being placed into splint until able to follow-up with orthopedics urgently.  Patient's cast removed, patient was placed into a thumb spica splint for stabilization.  Patient demonstrates understanding to call tomorrow to schedule follow-up appointment soon as possible, and to reschedule upcoming CT scan.  CRT, patient's pain, and swelling improved after transition to splint.  Splint instructions provided and discussed at length.  Patient in NAD and good condition at time of discharge.  After consideration the patient's encounter today, I do not feel today's workup suggests an emergent condition  requiring admission or immediate intervention beyond what has been performed at this time.  Safe for discharge; instructed to return immediately for worsening symptoms, change in symptoms or any other concerns.  I have reviewed the patients home medicines and have made adjustments as needed.  Discussed course of treatment with the patient, whom demonstrated understanding.  Patient in agreement and has no further questions.    I discussed this case with my attending physician Dr. Oswald Hillock, who agreed with the proposed treatment course or made changes to plan which were implemented.  This chart was dictated using voice recognition software.  Despite best efforts to proofread,  errors can occur which can change the documentation meaning.         Final Clinical Impression(s) / ED Diagnoses Final diagnoses:  Right wrist pain    Rx / DC Orders ED Discharge Orders     None         Candace Cruise [birth year]00 Rutherford Guys, MD 04/27/22 [id number]

## 2022-04-23 NOTE — ED Notes (Signed)
D/c paperwork reviewed with pt, including follow up care.  No questions or concerns voiced at time of d/c. . Pt verbalized understanding, Ambulatory without assistance to ED exit, NAD.   

## 2022-04-23 NOTE — ED Triage Notes (Signed)
Patient presents to ED via POV from home. Here with right arm pain. Patient reports recent fracture. Cast in place. Here with swelling x 3 days to right arm.

## 2022-04-26 DIAGNOSIS — I10 Essential (primary) hypertension: Secondary | ICD-10-CM | POA: Diagnosis not present

## 2022-04-26 DIAGNOSIS — F17291 Nicotine dependence, other tobacco product, in remission: Secondary | ICD-10-CM | POA: Diagnosis not present

## 2022-04-26 DIAGNOSIS — Z7984 Long term (current) use of oral hypoglycemic drugs: Secondary | ICD-10-CM | POA: Diagnosis not present

## 2022-04-26 DIAGNOSIS — R809 Proteinuria, unspecified: Secondary | ICD-10-CM | POA: Diagnosis not present

## 2022-04-26 DIAGNOSIS — Z7985 Long-term (current) use of injectable non-insulin antidiabetic drugs: Secondary | ICD-10-CM | POA: Diagnosis not present

## 2022-04-26 DIAGNOSIS — E1140 Type 2 diabetes mellitus with diabetic neuropathy, unspecified: Secondary | ICD-10-CM | POA: Diagnosis not present

## 2022-04-26 DIAGNOSIS — Z794 Long term (current) use of insulin: Secondary | ICD-10-CM | POA: Diagnosis not present

## 2022-04-26 DIAGNOSIS — E1129 Type 2 diabetes mellitus with other diabetic kidney complication: Secondary | ICD-10-CM | POA: Diagnosis not present

## 2022-04-30 DIAGNOSIS — E1165 Type 2 diabetes mellitus with hyperglycemia: Secondary | ICD-10-CM | POA: Diagnosis not present

## 2022-05-01 DIAGNOSIS — R200 Anesthesia of skin: Secondary | ICD-10-CM | POA: Diagnosis not present

## 2022-05-01 DIAGNOSIS — E118 Type 2 diabetes mellitus with unspecified complications: Secondary | ICD-10-CM | POA: Diagnosis not present

## 2022-05-01 DIAGNOSIS — G5621 Lesion of ulnar nerve, right upper limb: Secondary | ICD-10-CM | POA: Diagnosis not present

## 2022-05-01 DIAGNOSIS — R202 Paresthesia of skin: Secondary | ICD-10-CM | POA: Diagnosis not present

## 2022-05-01 DIAGNOSIS — G5601 Carpal tunnel syndrome, right upper limb: Secondary | ICD-10-CM | POA: Diagnosis not present

## 2022-05-03 DIAGNOSIS — R200 Anesthesia of skin: Secondary | ICD-10-CM | POA: Diagnosis not present

## 2022-05-03 DIAGNOSIS — S62011A Displaced fracture of distal pole of navicular [scaphoid] bone of right wrist, initial encounter for closed fracture: Secondary | ICD-10-CM | POA: Diagnosis not present

## 2022-05-03 DIAGNOSIS — G5621 Lesion of ulnar nerve, right upper limb: Secondary | ICD-10-CM | POA: Diagnosis not present

## 2022-05-03 DIAGNOSIS — R202 Paresthesia of skin: Secondary | ICD-10-CM | POA: Diagnosis not present

## 2022-05-03 DIAGNOSIS — M25531 Pain in right wrist: Secondary | ICD-10-CM | POA: Diagnosis not present

## 2022-05-03 DIAGNOSIS — G5601 Carpal tunnel syndrome, right upper limb: Secondary | ICD-10-CM | POA: Diagnosis not present

## 2022-05-08 DIAGNOSIS — H9193 Unspecified hearing loss, bilateral: Secondary | ICD-10-CM | POA: Diagnosis not present

## 2022-05-08 DIAGNOSIS — I10 Essential (primary) hypertension: Secondary | ICD-10-CM | POA: Diagnosis not present

## 2022-05-08 DIAGNOSIS — H9191 Unspecified hearing loss, right ear: Secondary | ICD-10-CM | POA: Diagnosis not present

## 2022-05-08 DIAGNOSIS — K5909 Other constipation: Secondary | ICD-10-CM | POA: Diagnosis not present

## 2022-05-11 DIAGNOSIS — I700 Atherosclerosis of aorta: Secondary | ICD-10-CM | POA: Diagnosis not present

## 2022-05-11 DIAGNOSIS — J449 Chronic obstructive pulmonary disease, unspecified: Secondary | ICD-10-CM | POA: Diagnosis not present

## 2022-05-11 DIAGNOSIS — J9611 Chronic respiratory failure with hypoxia: Secondary | ICD-10-CM | POA: Diagnosis not present

## 2022-05-11 DIAGNOSIS — R918 Other nonspecific abnormal finding of lung field: Secondary | ICD-10-CM | POA: Diagnosis not present

## 2022-05-11 DIAGNOSIS — S6991XA Unspecified injury of right wrist, hand and finger(s), initial encounter: Secondary | ICD-10-CM | POA: Diagnosis not present

## 2022-05-11 DIAGNOSIS — S62011A Displaced fracture of distal pole of navicular [scaphoid] bone of right wrist, initial encounter for closed fracture: Secondary | ICD-10-CM | POA: Diagnosis not present

## 2022-05-11 DIAGNOSIS — Z87891 Personal history of nicotine dependence: Secondary | ICD-10-CM | POA: Diagnosis not present

## 2022-05-11 DIAGNOSIS — J9690 Respiratory failure, unspecified, unspecified whether with hypoxia or hypercapnia: Secondary | ICD-10-CM | POA: Diagnosis not present

## 2022-05-19 DIAGNOSIS — J9611 Chronic respiratory failure with hypoxia: Secondary | ICD-10-CM | POA: Diagnosis not present

## 2022-05-29 DIAGNOSIS — E1165 Type 2 diabetes mellitus with hyperglycemia: Secondary | ICD-10-CM | POA: Diagnosis not present

## 2023-10-27 ENCOUNTER — Emergency Department (HOSPITAL_BASED_OUTPATIENT_CLINIC_OR_DEPARTMENT_OTHER)

## 2023-10-27 ENCOUNTER — Emergency Department (HOSPITAL_BASED_OUTPATIENT_CLINIC_OR_DEPARTMENT_OTHER)
Admission: EM | Admit: 2023-10-27 | Discharge: 2023-10-27 | Disposition: A | Discharge status: Home / Self Care | Attending: Emergency Medicine | Admitting: Emergency Medicine

## 2023-10-27 ENCOUNTER — Other Ambulatory Visit: Payer: Self-pay

## 2023-10-27 ENCOUNTER — Encounter (HOSPITAL_BASED_OUTPATIENT_CLINIC_OR_DEPARTMENT_OTHER): Payer: Self-pay | Admitting: Emergency Medicine

## 2023-10-27 DIAGNOSIS — Z87891 Personal history of nicotine dependence: Secondary | ICD-10-CM | POA: Insufficient documentation

## 2023-10-27 DIAGNOSIS — E119 Type 2 diabetes mellitus without complications: Secondary | ICD-10-CM | POA: Diagnosis not present

## 2023-10-27 DIAGNOSIS — M25561 Pain in right knee: Secondary | ICD-10-CM | POA: Insufficient documentation

## 2023-10-27 DIAGNOSIS — W19XXXA Unspecified fall, initial encounter: Secondary | ICD-10-CM | POA: Diagnosis not present

## 2023-10-27 DIAGNOSIS — J449 Chronic obstructive pulmonary disease, unspecified: Secondary | ICD-10-CM | POA: Diagnosis not present

## 2023-10-27 DIAGNOSIS — M25461 Effusion, right knee: Secondary | ICD-10-CM | POA: Insufficient documentation

## 2023-10-27 DIAGNOSIS — Z8572 Personal history of non-Hodgkin lymphomas: Secondary | ICD-10-CM | POA: Insufficient documentation

## 2023-10-27 DIAGNOSIS — Z794 Long term (current) use of insulin: Secondary | ICD-10-CM | POA: Diagnosis not present

## 2023-10-27 HISTORY — DX: Cerebral infarction, unspecified: I63.9

## 2023-10-27 HISTORY — DX: Non-Hodgkin lymphoma, unspecified, unspecified site: C85.90

## 2023-10-27 MED ORDER — HYDROCODONE-ACETAMINOPHEN 5-325 MG PO TABS: 1.0000 | ORAL_TABLET | Freq: Four times a day (QID) | ORAL | 0 refills | Status: AC | PRN

## 2023-10-27 NOTE — Discharge Instructions (Addendum)
 Take the pain medicine as directed.  Knee immobilizer for comfort.  Can remove it to shower.  Make an appointment to follow-up with orthopedics.  X-ray done of the right knee today shows a lot of arthritic changes.  No bony abnormality.  But she could have an internal injury to the knee this is the reason for follow-up with orthopedics.

## 2023-10-27 NOTE — ED Triage Notes (Signed)
 Pt c/o RLE pain, more so in the knee,  after falling about 1 wk ago

## 2023-10-27 NOTE — ED Provider Notes (Signed)
 [place] EMERGENCY DEPARTMENT AT [facility] Provider Note   CSN: [id number] Arrival date & time: 10/27/23  1558     Patient presents with: Fall and Leg Pain   Sandra Wade is a 73 y.o. female.   Patient with a fall from sitting position a week ago.  Complaining of right knee pain and swelling since that time.  No other injuries.  Patient currently does not have an orthopedic doctor that she is followed by.  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  No longer on that.  Past medical history in for diabetes COPD lymphoma prior history of stroke.  Is followed by neurology at [facility], [place].  Past surgical history significant for abdominal hysterectomy and breast reconstruction.  Patient is a former smoker and quit in 2022.       Prior to Admission medications   Medication Sig Start Date End Date Taking? Authorizing Provider  HYDROcodone -acetaminophen  (NORCO/VICODIN) 5-325 MG tablet Take 1 tablet by mouth every 6 (six) hours as needed for moderate pain (pain score 4-6). 10/27/23  Yes Marea Reasner, MD  ALPRAZolam  (XANAX ) 1 MG tablet Take 1 mg by mouth at bedtime as needed for anxiety. 02/25/18   [provider]  amLODipine  (NORVASC ) 10 MG tablet Take 10 mg by mouth daily.    [provider]  benazepril  (LOTENSIN ) 40 MG tablet Take 40 mg by mouth daily.    [provider]  cloNIDine  (CATAPRES ) 0.1 MG tablet Take 1 tablet (0.1 mg total) by mouth at bedtime. 03/05/18   Fairy Frames, MD  divalproex  (DEPAKOTE ) 500 MG DR tablet Take 500 mg by mouth 2 (two) times daily.  06/23/16   [provider]  LANTUS  SOLOSTAR 100 UNIT/ML Solostar Pen Inject 10 Units into the skin at bedtime.  09/11/16   [provider]  methadone  (DOLOPHINE ) 10 MG tablet Take 90 mg by mouth daily.     [provider]  OLANZapine  (ZYPREXA ) 10 MG tablet Take 10 mg by mouth at bedtime. 11/08/17   [provider]  ONE TOUCH ULTRA TEST test strip  09/11/16   [provider]  VICTOZA 18 MG/3ML SOPN Inject 18 mg into the skin every morning.  09/11/16   [provider]    Allergies: Patient has no known allergies.    Review of Systems  Constitutional:  Negative for chills and fever.  HENT:  Negative for ear pain and sore throat.   Eyes:  Negative for pain and visual disturbance.  Respiratory:  Negative for cough and shortness of breath.   Cardiovascular:  Negative for chest pain and palpitations.  Gastrointestinal:  Negative for abdominal pain and vomiting.  Genitourinary:  Negative for dysuria and hematuria.  Musculoskeletal:  Positive for joint swelling. Negative for arthralgias and back pain.  Skin:  Negative for color change and rash.  Neurological:  Negative for seizures and syncope.  All other systems reviewed and are negative.   Updated Vital Signs BP (!) 160/89 (BP Location: Right Arm)   Pulse 90   Temp 99 F (37.2 C)   Resp 18   Ht 1.575 m (5' 2)   Wt 59 kg   SpO2 94%   BMI 23.78 kg/m   Physical Exam Vitals and nursing note reviewed.  Constitutional:      General: She is not in acute distress.    Appearance: Normal appearance. She is well-developed.  HENT:     Head: Normocephalic and  atraumatic.  Eyes:     Extraocular Movements: Extraocular movements intact.     Conjunctiva/sclera: Conjunctivae normal.     Pupils: Pupils are equal, round, and reactive to light.  Cardiovascular:     Rate and Rhythm: Normal rate and regular rhythm.     Heart sounds: No murmur heard. Pulmonary:     Effort: Pulmonary effort is normal. No respiratory distress.     Breath sounds: Normal breath sounds.  Abdominal:     Palpations: Abdomen is soft.     Tenderness: There is no abdominal tenderness.  Musculoskeletal:        General: Swelling present.     Cervical back: Neck supple.     Comments: Swelling and increased warmth to the right knee.  No erythema.  No obvious  dislocation.  Distally neurovascularly intact.  Good cap refill dorsalis pedis 1+.  Skin:    General: Skin is warm and dry.     Capillary Refill: Capillary refill takes less than 2 seconds.  Neurological:     General: No focal deficit present.     Mental Status: She is alert and oriented to person, place, and time.  Psychiatric:        Mood and Affect: Mood normal.     (all labs ordered are listed, but only abnormal results are displayed) Labs Reviewed - No data to display  EKG: None  Radiology: DG Knee Complete 4 Views Right Result Date: 10/27/2023 CLINICAL DATA:  Fall he a joint effusion is present. EXAM: RIGHT KNEE - COMPLETE 4+ VIEW COMPARISON:  None Available. FINDINGS: Peripheral vascular calcifications are seen in the soft tissues. There is no acute fracture or dislocation. There is patellofemoral and medial compartment mild joint space narrowing. Tricompartmental osteophyte formation is present. There is also chondrocalcinosis of the lateral compartment. IMPRESSION: 1. No acute fracture or dislocation. 2. Tricompartmental degenerative changes. 3. Chondrocalcinosis of the lateral compartment. Electronically Signed   By: Greig Pique M.D.   On: 10/27/2023 16:42     Procedures   Medications Ordered in the ED - No data to display                                  Medical Decision Making Amount and/or Complexity of Data Reviewed Radiology: ordered.   Acute injury to the right knee.  X-ray without any bony abnormalities but does show tricompartmental degenerative changes.  Is possible this just could be inflammatory arthritis.  But in relation to the fall need follow-up with orthopedics to rule out any internal ligament injury.    Final diagnoses:  Fall, initial encounter  Acute pain of right knee    ED Discharge Orders          Ordered    HYDROcodone -acetaminophen  (NORCO/VICODIN) 5-325 MG tablet  Every 6 hours PRN        10/27/23 1741                Jorden Minchey, MD 10/27/23 1742

## 2023-10-29 ENCOUNTER — Telehealth: Payer: Self-pay | Admitting: Orthopaedic Surgery

## 2023-10-29 NOTE — Telephone Encounter (Signed)
 noted

## 2023-10-29 NOTE — Telephone Encounter (Signed)
 Patient called and came from the ER, Needs to be seen

## 2023-11-07 ENCOUNTER — Ambulatory Visit (HOSPITAL_BASED_OUTPATIENT_CLINIC_OR_DEPARTMENT_OTHER): Admitting: Orthopaedic Surgery

## 2023-11-21 ENCOUNTER — Ambulatory Visit (HOSPITAL_BASED_OUTPATIENT_CLINIC_OR_DEPARTMENT_OTHER): Admitting: Orthopaedic Surgery

## 2023-11-21 ENCOUNTER — Encounter (HOSPITAL_BASED_OUTPATIENT_CLINIC_OR_DEPARTMENT_OTHER): Payer: Self-pay | Admitting: Orthopaedic Surgery

## 2023-11-21 ENCOUNTER — Ambulatory Visit (HOSPITAL_BASED_OUTPATIENT_CLINIC_OR_DEPARTMENT_OTHER)

## 2023-11-21 ENCOUNTER — Other Ambulatory Visit (HOSPITAL_BASED_OUTPATIENT_CLINIC_OR_DEPARTMENT_OTHER): Payer: Self-pay

## 2023-11-21 DIAGNOSIS — M25561 Pain in right knee: Secondary | ICD-10-CM

## 2023-11-21 MED ORDER — LIDOCAINE HCL 1 % IJ SOLN
4.0000 mL | INTRAMUSCULAR | Status: AC | PRN
  Administered 2023-11-21: 4 mL

## 2023-11-21 MED ORDER — TRIAMCINOLONE ACETONIDE 40 MG/ML IJ SUSP
80.0000 mg | INTRAMUSCULAR | Status: AC | PRN
  Administered 2023-11-21: 80 mg via INTRA_ARTICULAR

## 2023-11-21 NOTE — Progress Notes (Signed)
 Chief Complaint: Right knee pain     History of Present Illness:    Sandra Wade is a 73 y.o. female presents today with ongoing right knee pain in the setting of known osteoarthritis.  She does state that she recently had a stroke and as a result was feeling very sleepy and passed out from a high top chair.  Since that time she has had swelling and pain in the knee.  She has been difficulty with putting weight on it.  She does feel like this is buckling    PMH/PSH/Family History/Social History/Meds/Allergies:    Past Medical History:  Diagnosis Date  . Breast cancer (HCC) 1993  . Breast disorder 1993   Left breast cancer   . COPD (chronic obstructive pulmonary disease) (HCC)   . Diabetes mellitus without complication (HCC)   . Hypertension   . Lymphoma (HCC)   . Mental disorder   . Stroke [facility])    Past Surgical History:  Procedure Laterality Date  . ABDOMINAL HYSTERECTOMY    . BACK SURGERY    . BREAST RECONSTRUCTION     Social History   Socioeconomic History  . Marital status: Single    Spouse name: Not on file  . Number of children: Not on file  . Years of education: Not on file  . Highest education level: Not on file  Occupational History  . Not on file  Tobacco Use  . Smoking status: Former    Current packs/day: 0.00    Average packs/day: 1.5 packs/day for 20.0 years (30.0 ttl pk-yrs)    Types: Cigarettes    Start date: 02/28/2001    Quit date: 02/28/2021    Years since quitting: 2.7  . Smokeless tobacco: Never  . Tobacco comments:    stopped 7 days ago as of 03/02/18  Substance and Sexual Activity  . Alcohol use: Yes  . Drug use: No  . Sexual activity: Not on file  Other Topics Concern  . Not on file  Social History Narrative  . Not on file   Social Drivers of Health   Financial Resource Strain: Not on file  Food Insecurity: Low Risk  (11/08/2023)   Received from [facility]   Hunger Vital Sign   . Within the past 12 months, you  worried that your food would run out before you got money to buy more: Never true   . Within the past 12 months, the food you bought just didn't last and you didn't have money to get more. : Never true  Transportation Needs: No Transportation Needs (11/08/2023)   Received from [facility]   . In the past 12 months, has lack of reliable transportation kept you from medical appointments, meetings, work or from getting things needed for daily living? : No  Physical Activity: Not on file  Stress: Not on file  Social Connections: Not on file   Family History  Problem Relation Age of Onset  . Hypertension Mother   . Stroke Brother   . Cancer Neg Hx   . Diabetes Neg Hx    No Known Allergies Current Outpatient Medications  Medication Sig Dispense Refill  . ALPRAZolam  (XANAX ) 1 MG tablet Take 1 mg by mouth at bedtime as needed for anxiety.    . amLODipine  (NORVASC ) 10 MG tablet Take 10 mg by mouth daily.    . benazepril  (LOTENSIN ) 40 MG tablet Take 40 mg by mouth daily.    . cloNIDine  (CATAPRES ) 0.1  MG tablet Take 1 tablet (0.1 mg total) by mouth at bedtime.    . divalproex  (DEPAKOTE ) 500 MG DR tablet Take 500 mg by mouth 2 (two) times daily.     . HYDROcodone -acetaminophen  (NORCO/VICODIN) 5-325 MG tablet Take 1 tablet by mouth every 6 (six) hours as needed for moderate pain (pain score 4-6). 14 tablet 0  . LANTUS  SOLOSTAR 100 UNIT/ML Solostar Pen Inject 10 Units into the skin at bedtime.     . methadone  (DOLOPHINE ) 10 MG tablet Take 90 mg by mouth daily.     . OLANZapine  (ZYPREXA ) 10 MG tablet Take 10 mg by mouth at bedtime.    . ONE TOUCH ULTRA TEST test strip     . VICTOZA 18 MG/3ML SOPN Inject 18 mg into the skin every morning.      No current facility-administered medications for this visit.   No results found.  Review of Systems:   A ROS was performed including pertinent positives and negatives as documented in the HPI.  Physical Exam :   Constitutional: NAD and  appears stated age Neurological: Alert and oriented Psych: Appropriate affect and cooperative There were no vitals taken for this visit.   Comprehensive Musculoskeletal Exam:    Right knee with effusion and tricompartmental tenderness range of motion is from 0 to 130 degrees negative ligamentous exam with normal distal neurosensory exam   Imaging:   Xray (4 views right knee): Tricompartmental osteoarthritis    I personally reviewed and interpreted the radiographs.   Assessment and Plan:   73 y.o. female with evidence of tricompartmental osteoarthritis which is flared up after a fall.  At today's visit I did recommend aspiration injection of the knee to get her relief.  Will plan to proceed with this  -Right knee injection provided after verbal consent obtained   Procedure Note  Patient: Sandra Wade             Date of Birth: 09/08/1950           MRN: [id number]             Visit Date: 11/21/2023  Procedures: Visit Diagnoses:  1. Acute pain of right knee     Large Joint Inj: R knee on 11/21/2023 4:54 PM Indications: pain Details: 22 G 1.5 in needle, ultrasound-guided anterior approach  Arthrogram: No  Medications: 4 mL lidocaine  1 %; 80 mg triamcinolone  acetonide 40 MG/ML Outcome: tolerated well, no immediate complications Procedure, treatment alternatives, risks and benefits explained, specific risks discussed. Consent was given by the patient. Immediately prior to procedure a time out was called to verify the correct patient, procedure, equipment, support staff and site/side marked as required. Patient was prepped and draped in the usual sterile fashion.          I personally saw and evaluated the patient, and participated in the management and treatment plan.  Elspeth Parker, MD Attending Physician, Orthopedic Surgery  This document was dictated using Dragon voice recognition software. A reasonable attempt at proof reading has been made to minimize  errors.

## 2024-04-03 NOTE — Discharge Summary (Signed)
 Hospitalist  Discharge Summary   Name: Sandra Wade Age: 74 yrs  MRN: [id number] DOB: May 29, 1950  Admit Date: 04/01/2024 Admitting Physician: Dorina Sheena Butt, MD  Discharge Date: 04/03/2024 Discharge Physician: Roddie Curtistine Hamilton, MD   Admission Diagnosis:   Dizziness [R42] Closed fracture of left hip, initial encounter (CMD) [S72.002A] Displaced intertrochanteric fracture of left femur, initial encounter for closed fracture (CMD) [S72.142A] Closed left hip fracture, initial encounter (CMD) [S72.002A]   Discharge Diagnoses:   Principal Problem (Resolved):   Displaced intertrochanteric fracture of left femur, initial encounter for closed fracture (CMD) Active Problems:   Type 2 diabetes mellitus with proteinuria    (CMD)   Bipolar 2 disorder    (CMD)   Stage 3 severe COPD by GOLD classification    (CMD)   Venous insufficiency   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    (CMD)   H/O CVA, anterior circulation, right (CMD)   Impaired cognition   Macrocytic anemia Resolved Problems:   Closed left hip fracture, initial encounter (CMD)   TO DO List at Follow-up for PCP/Specialist:   Key Medication changes:  Increase aspirin to 81 mg BID x6 weeks, then on 05/15/24 resume taking 81 mg daily Start 1g tylenol  TID x14 days Start daily lidocaine  patch to left hip x14 days Start Oxycodone  10 mg Q6h severe pain x5 days Start vit D3 50,000 units weekly x6 weeks Start calcium carbonate 500 mg BID F/u with SNF physician Follow up with ortho in 2 weeks (referral placed) Referral placed to bone health clinic PT recs 5x weekly: Patient/Caregiver education, Therapeutic activity, Therapeutic exercise, Gait/mobility training, Neuromuscular re-education, Wheelchair management/mobility  OT recs 2x weekly: Basic activities of daily living, Secondary School Teacher, Radio producer, Building Services Engineer, Patient/Caregiver education, Therapeutic activities, Therapeutic  exercises      Hospital Course:   For full details, please see H&P, progress notes, consult notes and ancillary notes. Briefly, Sandra Wade is a 74 y.o. year old female with a PMH of metastatic breast cancer, type 2 diabetes mellitus with neuropathy, essential hypertension, stage 3 severe COPD, bipolar disorder, generalized anxiety disorder, chronic pain syndrome, and chronic constipation who was admitted following a mechanical ground-level fall at home resulting in left hip pain. The patient's hospital course will be summarized below.   On presentation, she was found to have a displaced intertrochanteric fracture of the left femur, confirmed by radiographs showing a comminuted intertrochanteric proximal femur fracture without evidence of pathologic fracture or metastatic disease in the left hip region.  She underwent open reduction and internal fixation of the left hip with an intramedullary nail on 04/01/2024. Postoperatively, she was maintained on 25% weight bearing precautions to the operative extremity, received DVT prophylaxis with enoxaparin , and was started on vitamin D supplementation for fragility fracture. Her postoperative course was notable for well-controlled pain, no adverse events, and stable wound healing without signs of infection. Physical and occupational therapy evaluations revealed significant functional impairment, requiring maximal assistance for bed mobility and transfers, and recommendations were made for post-acute multidisciplinary therapy and durable medical equipment including a wheelchair and walker.  Additional inpatient management included monitoring and coverage of hyperglycemia with insulin , continuation of her home psychiatric and pain regimens, and ongoing management of her chronic comorbidities including COPD and hypertension. Discharge planning was coordinated for post-acute placement, with therapy and case management recommending skilled nursing facility  care due to her high readmission risk and new functional deficits.    The patient's chronic medical conditions were treated  accordingly per the patient's home medication regimen except as noted in the plan above and in the medication list below.    Discharge Condition:   Disposition: Patient discharged to Skilled Nursing Facility (CMS Certified) in fair condition.  Diet at discharge: Ensure Plus; Three times daily with Meals Adult Diet- Consistent Carbohydrate; Med (45-60 gm/meal)  Activity at Discharge: 25% WB LLE  Wound/Incision Care Instructions: 25% WB LLE, bilateral ted hose, otherwise standard wound care  Wound 04/01/24 Incision Thigh Anterior;Left;Proximal (Active)  Date First Assessed: 04/01/24   Pre-Existing Wound: No  Primary Wound Type: Incision  Location: Thigh  Wound Location Orientation: Anterior;Left;Proximal  Wound Description (Comments): STAPLES, ADAPTIC, 4X4, ABD, TEGADERM     Wound 04/01/24 Incision Thigh Anterior;Distal;Left (Active)  Date First Assessed: 04/01/24   Pre-Existing Wound: No  Primary Wound Type: Incision  Location: Thigh  Wound Location Orientation: Anterior;Distal;Left  Wound Description (Comments): STAPLES, ADAPTIC, 4X4, ABD, TEGADERM     Wound 04/01/24 Incision Thigh Anterior;Left (Active)  Date First Assessed: 04/01/24   Primary Wound Type: Incision  Location: Thigh  Wound Location Orientation: Anterior;Left  Wound Description (Comments): STAPLES, ADAPTIC, 4X4, ABD, TEGADERM     Physical Exam at Discharge   BP 121/66 (BP Location: Right arm, Patient Position: Sitting)   Pulse 68   Temp 98.6 F (37 C) (Oral)   Resp 12   Ht 1.575 m (5' 2)   Wt 56.2 kg (124 lb)   SpO2 100%   BMI 22.68 kg/m  CONSTITUTIONAL: alert and oriented x3, no acute distress CARDIOVASCULAR: normal rate, regular rhythm; no murmurs appreciated, 2+ radial pulses, warm extremities RESPIRATORY: comfortable on [place], normal respiratory effort, no crackles appreciated ABDOMEN:  soft, non-tender, non-distended NEURO: CN II-XII grossly intact, spontaneously moves all extremities, no focal deficits noted MSK: TTP lateral left hip, bandages in place without significant drainage   Discharge Medications:       Medication List     START taking these medications    acetaminophen  500 mg tablet Commonly known as: TYLENOL  Take 2 tablets (1,000 mg total) by mouth every 8 (eight) hours for 14 days.   calcium carbonate 500 mg (200 mg calcium) chewable tablet Commonly known as: TUMS Chew 1 tablet (500 mg total) 2 (two) times a day.   cholecalciferol 1,250 mcg (50,000 unit) capsule Commonly known as: VITAMIN D3 Take 1 each (50,000 Units total) by mouth once a week.   lidocaine  4 % patch Commonly known as: SALONPAS Apply 1 patch topically daily for 14 days. Start taking on: April 04, 2024   oxyCODONE  10 mg Tab Commonly known as: ROXICODONE  Take 1 tablet (10 mg total) by mouth every 6 (six) hours as needed for severe pain (7-10).       CHANGE how you take these medications    amLODIPine  10 mg tablet Commonly known as: NORVASC  Take 1 tablet (10 mg total) by mouth daily.   aspirin 81 mg chewable tablet Take 1 tablet (81 mg total) by mouth twice daily for 6 weeks. Then on 05/15/24 resume taking 1 tablet (81 mg total) by mouth once daily. What changed:  how much to take how to take this when to take this additional instructions   benazepril  40 mg tablet Commonly known as: LOTENSIN  Take 1 tablet (40 mg total) by mouth daily Indications: high blood pressure.   Lancets Misc Commonly known as: Accu-Chek Softclix Lancets 1 each by miscellaneous route daily.   rosuvastatin 5 mg tablet Commonly known as: CRESTOR Take 1 tablet (5  mg total) by mouth daily.       CONTINUE taking these medications    Accu-Chek Guide test strips test strip Generic drug: glucose blood Use as instructed once daily   albuterol  HFA 90 mcg/actuation inhaler Commonly  known as: PROVENTIL  HFA;VENTOLIN  HFA;PROAIR  HFA Inhale 2 puffs every 6 (six) hours as needed for wheezing.   ALPRAZolam  1 mg tablet Commonly known as: XANAX  Take 1 tablet (1 mg total) by mouth nightly as needed for sleep.   anastrozole 1 mg tablet Commonly known as: ARIMIDEX Take 1 tablet (1 mg total) by mouth daily.   Breztri Aerosphere 160-9-4.8 mcg/actuation inhaler Generic drug: budesonide-glycopyr-formoterol Inhale 2 puffs 2 (two) times a day.   cloNIDine  0.1 mg tablet Commonly known as: CATAPRES  Take 1 tablet (0.1 mg total) by mouth 2 (two) times a day Indications: high blood pressure.   divalproex  500 mg 12 hr tablet Commonly known as: DEPAKOTE  DR Take 1 tablet (500 mg total) by mouth daily.   Farxiga 10 mg Tab tablet Generic drug: dapagliflozin propanediol TAKE 1 TABLET BY MOUTH DAILY   fluticasone propionate 50 mcg/spray nasal spray Commonly known as: FLONASE Administer 1 spray into each nostril daily.   Ibrance 125 mg chemo capsule Generic drug: palbociclib Take 1 capsule by mouth daily for 21 days, then rest for 7 days. (Take 1 capsule (125 mg total) by mouth daily. For 21 days on; Then 7 days off)   insulin  lispro 100 unit/mL KwikPen Commonly known as: HumaLOG KwikPen Inject 4 units with each meal and adjust as indicated   linaCLOtide  145 mcg Cap capsule Commonly known as: LINZESS  Take 1 capsule (145 mcg total) by mouth daily before meal.   methadone  oral liquid CONCENTRATE 10 mg/mL solution Commonly known as: DOLOPHINE  Take 100 mg by mouth daily.   multivitamin Cap Take 1 capsule by mouth daily.   OLANZapine  10 mg tablet Commonly known as: ZyPREXA  Take 1 tablet (10 mg total) by mouth nightly.   Ozempic 0.25 mg or 0.5 mg (2 mg/3 mL) subcutaneous pen injector Generic drug: semaglutide INJECT SUBCUTANEOUSLY 0.5 MG  EVERY WEEK AS DIRECTED   pen needle, diabetic 31 gauge x 5/16 Ndle Use to inject insulin  [place] four times daily E11.65   Premarin  0.625 mg/gram vaginal cream Generic drug: conjugated estrogens Insert  into the vagina Once Daily.   traZODone 300 mg tablet Commonly known as: DESYREL Take 1 tablet (300 mg total) by mouth at bedtime.         Where to Get Your Medications     These medications were sent to [id number] - [place], [place] - [address] AT [facility] OF [address] - PHONE: [phone] - FAX: [phone]  [address], [place] [address]    Phone: [phone]  lidocaine  4 % patch    You can get these medications from any pharmacy   Bring a paper prescription for each of these medications oxyCODONE  10 mg Tab    These medications will be mailed to you     From: [facility] - [place], [place] - [address] - PHONE: [phone] - FAX: [phone] Phone: [phone]  Farxiga 10 mg Tab tablet    Information about where to get these medications is not yet available   Ask your nurse or doctor about these medications acetaminophen  500 mg tablet aspirin 81 mg chewable tablet calcium carbonate 500 mg (200 mg calcium) chewable tablet cholecalciferol 1,250 mcg (  50,000 unit) capsule     Significant Diagnostic Tests:   LABS:  Lab Results  Component Value Date   WBC 9.87 04/03/2024   HGB 9.9 (L) 04/03/2024   HCT 28.8 (L) 04/03/2024   PLT 155 04/03/2024   CHOL 130 09/20/2023   TRIG 48 09/20/2023   HDL 66 09/20/2023   ALT 13 04/01/2024   AST 19 04/01/2024   NA 135 (L) 04/03/2024   K 3.9 04/03/2024   CL 96 (L) 04/03/2024   CREATININE 1.46 (H) 04/03/2024   BUN 37 (H) 04/03/2024   CO2 34 (H) 04/03/2024   PTT 32.9 09/20/2023   INR 1.0 09/20/2023   HGBA1C 9.7 (H) 04/01/2024   IMAGING:  FL Non Result Fluoro Up To 1 Hour  Final Result by SYSTEMGENERATED, DOCUMENTATION (01/13 1939)  This order was created for film storage, please see performing providers   notes for details.    XR Pelvis 1-2 Views  Final Result by Bettina Counter,  MD (01/13 1532)  X-RAY PELVIS (1-2 VIEWS), 04/01/2024 3:00 PM    INDICATION: fall   COMPARISON: None.    IMPRESSION:  1.  Acute left intertrochanteric fracture.  2.  No dislocations. Mild degenerative changes of the bilateral hips.      XR Femur Minimum 2 Vw Left  Final Result by Bettina Counter, MD (01/13 1530)  X-RAY FEMUR/THIGH LEFT (2+ VIEWS), 04/01/2024 3:00 PM    INDICATION: fall   COMPARISON: None.    IMPRESSION:  1.  Acute left intertrochanteric fracture.  2.  No dislocations.  3.  Mild associated soft tissue swelling.      XR Chest 1 View  Final Result by Bettina Counter, MD (01/13 1522)  XR CHEST 1 VIEW, 04/01/2024 3:00 PM    INDICATION:Left hip fracture.  Preop   COMPARISON:    FINDINGS:     Supportive devices: Staples within the left lateral chest wall.  Cardiovascular/lungs/pleura: Cardiac silhouette and pulmonary vasculature   are within normal limits. No focal consolidations. Trace right base   effusion. No pneumothorax.  Other: Unremarkable.    IMPRESSION:  No focal consolidations. Trace right base effusion.        Surgeries/Procedures:   Procedures (LRB): INTERTROCHANTERIC HIP FRACTURE FIXATION WITH NAIL (Left)   Consults:   IP CONSULT TO ORTHOPEDIC SURGERY IP CONSULT TO HOSPITALIST IP CONSULT TO SPIRITUAL CARE   Follow-up Appointments:     Future Appointments  Date Time Provider Department Center  04/21/2024  9:30 AM [facility] HEM ONC HP PERIPHERAL LAB [facility] HEMONC [facility] [place]  04/21/2024 10:00 AM Aida JONELLE Lunger, MD [facility] HEMONC [facility] [place]  06/16/2024  9:40 AM Elveria Melvenia Medicine, NP [facility] BH EME [facility] [address]  06/30/2024 11:00 AM Jama Lane Bearded, MD [facility] NEU [facility] [address]  08/06/2024  3:00 PM [place]  Almarie Boneta RIGGERS [facility] PAL [facility] [address]  08/15/2024 10:40 AM Prentice Lonni Graven, PA-C [facility] PUL PRE [facility]         Electronically signed by: Roddie Curtistine Hamilton, MD 04/03/2024 12:19 PM Time spent on discharge: 45  minutes.
